# Patient Record
Sex: Female | Born: 2000 | Race: White | Hispanic: Yes | Marital: Single | Smoking: Never smoker
Health system: Southern US, Community
[De-identification: ages and names within clinical notes are randomized; demographics above are authoritative.]

## PROBLEM LIST (undated history)

## (undated) DIAGNOSIS — G43909 Migraine, unspecified, not intractable, without status migrainosus: Secondary | ICD-10-CM

## (undated) HISTORY — DX: Migraine, unspecified, not intractable, without status migrainosus: G43.909

## (undated) HISTORY — PX: NO PAST SURGERIES: SHX2092

---
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
## 2018-03-08 ENCOUNTER — Encounter: Payer: Self-pay | Admitting: Advanced Practice Midwife

## 2018-03-08 ENCOUNTER — Ambulatory Visit (INDEPENDENT_AMBULATORY_CARE_PROVIDER_SITE_OTHER): Payer: Medicaid Other | Admitting: Advanced Practice Midwife

## 2018-03-08 ENCOUNTER — Other Ambulatory Visit (HOSPITAL_COMMUNITY)
Admission: RE | Admit: 2018-03-08 | Discharge: 2018-03-08 | Disposition: A | Discharge status: Home / Self Care | Payer: Medicaid Other | Source: Ambulatory Visit | Attending: Advanced Practice Midwife | Admitting: Advanced Practice Midwife

## 2018-03-08 VITALS — BP 126/78 | HR 91 | Ht 64.0 in | Wt 212.0 lb

## 2018-03-08 DIAGNOSIS — Z1379 Encounter for other screening for genetic and chromosomal anomalies: Secondary | ICD-10-CM

## 2018-03-08 DIAGNOSIS — Z3402 Encounter for supervision of normal first pregnancy, second trimester: Secondary | ICD-10-CM | POA: Insufficient documentation

## 2018-03-08 DIAGNOSIS — Z3400 Encounter for supervision of normal first pregnancy, unspecified trimester: Secondary | ICD-10-CM

## 2018-03-08 DIAGNOSIS — O219 Vomiting of pregnancy, unspecified: Secondary | ICD-10-CM

## 2018-03-08 NOTE — Progress Notes (Signed)
   PRENATAL VISIT NOTE  Subjective:  Rebecca Woodward is a 17 y.o. G1P0 at [redacted]w[redacted]d being seen today for initial prenatal visit.  She is currently monitored for the following issues for this low-risk pregnancy and does not have a problem list on file.  Patient reports occasional nausea.   . Vag. Bleeding: None.  Movement: Absent. Denies leaking of fluid.   The following portions of the patient's history were reviewed and updated as appropriate: allergies, current medications, past family history, past medical history, past social history, past surgical history and problem list. Problem list updated.  Objective:   Vitals:   03/08/18 0816 03/08/18 0819  BP: 126/78   Pulse: 91   Weight: 212 lb (96.2 kg)   Height:   (1.626 m)    Fetal Status: Fetal Heart Rate (bpm): 154   Movement: Absent    VS reviewed, nursing note reviewed,  Constitutional: well developed, well nourished, no distress HEENT: normocephalic HEART: normal rate, heart sounds, regular rhythm RESP: normal effort, lung sounds clear and equal bilaterally Breast Exam:  Deferred Abdomen: soft Neuro: alert and oriented x 3 Skin: warm, dry Psych: affect normal Pelvic exam: Deferred  Assessment and Plan:  Pregnancy: G1P0 at [redacted]w[redacted]d  1. Supervision of normal first pregnancy, antepartum --Reviewed safety in pregnancy --Offered genetic screening including NIPS testing.  Discussed risks/benefits/alternatives including selecting not to do serum genetic screening. Pt desires genetic screening and FOB has family hx of Trisomy 34. - Obstetric panel - HIV antibody (with reflex) - GC/Chlamydia probe amp ([place])not at [facility] - Cystic fibrosis diagnostic study  2. Encounter for genetic screening for birth defect  - [place] MFM OB COMP + 14 WK; Future - Genetic Screening  3. Nausea and vomiting during pregnancy prior to [redacted] weeks gestation --Pt managing well with diet, improving since early pregnancy.  Declines medication  management.  Preterm labor symptoms and general obstetric precautions including but not limited to vaginal bleeding, contractions, leaking of fluid and fetal movement were reviewed in detail with the patient. Please refer to After Visit Summary for other counseling recommendations.  Return in about 1 month (around 04/05/2018).  Future Appointments  Date Time Provider Department Center  04/05/2018  8:15 AM Kendell Bane [facility]  04/09/2018  8:45 AM [facility] [place] 2 [facility]    Sharen Counter, CNM

## 2018-03-08 NOTE — Progress Notes (Signed)
FOB's brother has Down Syndrome. PT interested in genetic testing

## 2018-03-08 NOTE — Patient Instructions (Signed)

## 2018-03-09 ENCOUNTER — Other Ambulatory Visit: Payer: Medicaid Other

## 2018-03-09 DIAGNOSIS — Z3400 Encounter for supervision of normal first pregnancy, unspecified trimester: Secondary | ICD-10-CM | POA: Insufficient documentation

## 2018-03-09 LAB — GC/CHLAMYDIA PROBE AMP (~~LOC~~) NOT AT ARMC
Chlamydia: NEGATIVE
Neisseria Gonorrhea: NEGATIVE

## 2018-03-16 LAB — OBSTETRIC PANEL
Antibody Screen: NOT DETECTED
Basophils Absolute: 23 cells/uL (ref 0–200)
Basophils Relative: 0.3 %
Eosinophils Absolute: 239 cells/uL (ref 15–500)
Eosinophils Relative: 3.1 %
HCT: 33.9 % — ABNORMAL LOW (ref 35.0–45.0)
Hemoglobin: 11.4 g/dL — ABNORMAL LOW (ref 11.7–15.5)
Hepatitis B Surface Ag: NONREACTIVE
Lymphs Abs: 1409 cells/uL (ref 850–3900)
MCH: 27.3 pg (ref 27.0–33.0)
MCHC: 33.6 g/dL (ref 32.0–36.0)
MCV: 81.3 fL (ref 80.0–100.0)
MPV: 10.5 fL (ref 7.5–12.5)
Monocytes Relative: 5.9 %
Neutro Abs: 5575 cells/uL (ref 1500–7800)
Neutrophils Relative %: 72.4 %
Platelets: 293 10*3/uL (ref 140–400)
RBC: 4.17 10*6/uL (ref 3.80–5.10)
RDW: 14.4 % (ref 11.0–15.0)
RPR Ser Ql: NONREACTIVE
Rubella: 4.3 index
Total Lymphocyte: 18.3 %
WBC mixed population: 454 cells/uL (ref 200–950)
WBC: 7.7 10*3/uL (ref 3.8–10.8)

## 2018-03-16 LAB — CYSTIC FIBROSIS DIAGNOSTIC STUDY

## 2018-03-16 LAB — HIV ANTIBODY (ROUTINE TESTING W REFLEX): HIV 1&2 Ab, 4th Generation: NONREACTIVE

## 2018-03-31 ENCOUNTER — Encounter (HOSPITAL_COMMUNITY): Payer: Self-pay

## 2018-04-05 ENCOUNTER — Ambulatory Visit (INDEPENDENT_AMBULATORY_CARE_PROVIDER_SITE_OTHER): Payer: Medicaid Other | Admitting: Advanced Practice Midwife

## 2018-04-05 ENCOUNTER — Encounter (INDEPENDENT_AMBULATORY_CARE_PROVIDER_SITE_OTHER): Payer: Self-pay | Admitting: *Deleted

## 2018-04-05 VITALS — BP 110/70 | HR 94 | Wt 213.0 lb

## 2018-04-05 DIAGNOSIS — Z029 Encounter for administrative examinations, unspecified: Secondary | ICD-10-CM

## 2018-04-05 DIAGNOSIS — Z3402 Encounter for supervision of normal first pregnancy, second trimester: Secondary | ICD-10-CM

## 2018-04-05 DIAGNOSIS — Z3400 Encounter for supervision of normal first pregnancy, unspecified trimester: Secondary | ICD-10-CM

## 2018-04-05 NOTE — Patient Instructions (Signed)

## 2018-04-05 NOTE — Progress Notes (Signed)
   PRENATAL VISIT NOTE  Subjective:  Rebecca Woodward is a 17 y.o. G1P0 at 4174w6d being seen today for ongoing prenatal care.  She is currently monitored for the following issues for this low-risk pregnancy and has Supervision of normal first pregnancy, antepartum on their problem list.  Patient reports no complaints.   . Vag. Bleeding: None.  Movement: Absent. Denies leaking of fluid.   The following portions of the patient's history were reviewed and updated as appropriate: allergies, current medications, past family history, past medical history, past social history, past surgical history and problem list. Problem list updated.  Objective:   Vitals:   04/05/18 0808  BP: 110/70  Pulse: 94  Weight: 213 lb (96.6 kg)    Fetal Status: Fetal Heart Rate (bpm): 147   Movement: Absent     General:  Alert, oriented and cooperative. Patient is in no acute distress.  Skin: Skin is warm and dry. No rash noted.   Cardiovascular: Normal heart rate noted  Respiratory: Normal respiratory effort, no problems with respiration noted  Abdomen: Soft, gravid, appropriate for gestational age.  Pain/Pressure: Absent     Pelvic: Cervical exam deferred        Extremities: Normal range of motion.  Edema: None  Mental Status: Normal mood and affect. Normal behavior. Normal judgment and thought content.   Assessment and Plan:  Pregnancy: G1P0 at 7274w6d  1. Supervision of normal first pregnancy, antepartum --Anticipatory guidance about next visits/weeks of pregnancy given --Reviewed normal lab results, including genetic screening --Anatomy US scheduled this week - Culture, OB Urine - Alpha fetoprotein, maternal  Preterm labor symptoms and general obstetric precautions including but not limited to vaginal bleeding, contractions, leaking of fluid and fetal movement were reviewed in detail with the patient. Please refer to After Visit Summary for other counseling recommendations.  Return in about 1 month  (around 05/03/2018).  Future Appointments  Date Time Provider Department Center  04/09/2018  8:45 AM [facility] US 2 [facility]    Sharen CounterLisa Leftwich-Kirby, CNM

## 2018-04-07 LAB — URINE CULTURE, OB REFLEX

## 2018-04-07 LAB — ALPHA FETOPROTEIN, MATERNAL
AFP MoM: 0.51
AFP, Serum: 19.6 ng/mL
Calc'd Gestational Age: 18.9 weeks
Maternal Wt: 213 [lb_av]
Risk for ONTD: <1
Twins-AFP: 1

## 2018-04-07 LAB — CULTURE, OB URINE

## 2018-04-09 ENCOUNTER — Other Ambulatory Visit: Payer: Self-pay | Admitting: Advanced Practice Midwife

## 2018-04-09 ENCOUNTER — Ambulatory Visit (HOSPITAL_COMMUNITY)
Admission: RE | Admit: 2018-04-09 | Discharge: 2018-04-09 | Disposition: A | Discharge status: Home / Self Care | Payer: Medicaid Other | Source: Ambulatory Visit | Attending: Advanced Practice Midwife | Admitting: Advanced Practice Midwife

## 2018-04-09 DIAGNOSIS — Z1379 Encounter for other screening for genetic and chromosomal anomalies: Secondary | ICD-10-CM

## 2018-04-09 DIAGNOSIS — Z3A17 17 weeks gestation of pregnancy: Secondary | ICD-10-CM | POA: Insufficient documentation

## 2018-04-09 DIAGNOSIS — Z3492 Encounter for supervision of normal pregnancy, unspecified, second trimester: Secondary | ICD-10-CM

## 2018-04-09 DIAGNOSIS — Z3A19 19 weeks gestation of pregnancy: Secondary | ICD-10-CM

## 2018-04-09 DIAGNOSIS — O99212 Obesity complicating pregnancy, second trimester: Secondary | ICD-10-CM | POA: Insufficient documentation

## 2018-05-03 ENCOUNTER — Ambulatory Visit (INDEPENDENT_AMBULATORY_CARE_PROVIDER_SITE_OTHER): Payer: Medicaid Other | Admitting: Advanced Practice Midwife

## 2018-05-03 VITALS — BP 119/75 | HR 100 | Wt 216.0 lb

## 2018-05-03 DIAGNOSIS — Z3402 Encounter for supervision of normal first pregnancy, second trimester: Secondary | ICD-10-CM

## 2018-05-03 DIAGNOSIS — Z3400 Encounter for supervision of normal first pregnancy, unspecified trimester: Secondary | ICD-10-CM

## 2018-05-03 DIAGNOSIS — Z1379 Encounter for other screening for genetic and chromosomal anomalies: Secondary | ICD-10-CM

## 2018-05-03 NOTE — Patient Instructions (Signed)

## 2018-05-03 NOTE — Progress Notes (Signed)
   PRENATAL VISIT NOTE  Subjective:  Rebecca Woodward is a 17 y.o. G1P0 at 3883w5d being seen today for ongoing prenatal care.  She is currently monitored for the following issues for this low-risk pregnancy and has Supervision of normal first pregnancy, antepartum on their problem list.  Patient reports no complaints.  Contractions: Not present. Vag. Bleeding: None.  Movement: Present. Denies leaking of fluid.   The following portions of the patient's history were reviewed and updated as appropriate: allergies, current medications, past family history, past medical history, past social history, past surgical history and problem list. Problem list updated.  Objective:   Vitals:   05/03/18 0805  BP: 119/75  Pulse: 100  Weight: 216 lb (98 kg)    Fetal Status: Fetal Heart Rate (bpm): 148   Movement: Present     General:  Alert, oriented and cooperative. Patient is in no acute distress.  Skin: Skin is warm and dry. No rash noted.   Cardiovascular: Normal heart rate noted  Respiratory: Normal respiratory effort, no problems with respiration noted  Abdomen: Soft, gravid, appropriate for gestational age.  Pain/Pressure: Absent     Pelvic: Cervical exam deferred        Extremities: Normal range of motion.  Edema: None  Mental Status: Normal mood and affect. Normal behavior. Normal judgment and thought content.   Assessment and Plan:  Pregnancy: G1P0 at 8483w5d  1. Encounter for genetic screening --Follow up to complete anatomy US in 4 weeks - US MFM OB FOLLOW UP; Future  2. Supervision of normal first pregnancy, antepartum --Anticipatory guidance about next visits/weeks of pregnancy given.  Preterm labor symptoms and general obstetric precautions including but not limited to vaginal bleeding, contractions, leaking of fluid and fetal movement were reviewed in detail with the patient. Please refer to After Visit Summary for other counseling recommendations.  No follow-ups on  file.  Future Appointments  Date Time Provider Department Center  05/31/2018  8:15 AM Aviva SignsWilliams, Marie L, CNM [facility]    Sharen CounterLisa Leftwich-Kirby, CNM

## 2018-05-14 ENCOUNTER — Ambulatory Visit (HOSPITAL_COMMUNITY)
Admission: RE | Admit: 2018-05-14 | Discharge: 2018-05-14 | Disposition: A | Discharge status: Home / Self Care | Payer: Medicaid Other | Source: Ambulatory Visit | Attending: Advanced Practice Midwife | Admitting: Advanced Practice Midwife

## 2018-05-14 DIAGNOSIS — Z3A22 22 weeks gestation of pregnancy: Secondary | ICD-10-CM | POA: Diagnosis not present

## 2018-05-14 DIAGNOSIS — Z362 Encounter for other antenatal screening follow-up: Secondary | ICD-10-CM | POA: Insufficient documentation

## 2018-05-14 DIAGNOSIS — O99212 Obesity complicating pregnancy, second trimester: Secondary | ICD-10-CM

## 2018-05-14 DIAGNOSIS — Z1379 Encounter for other screening for genetic and chromosomal anomalies: Secondary | ICD-10-CM

## 2018-05-31 ENCOUNTER — Encounter: Payer: Self-pay | Admitting: Advanced Practice Midwife

## 2018-05-31 ENCOUNTER — Ambulatory Visit (INDEPENDENT_AMBULATORY_CARE_PROVIDER_SITE_OTHER): Payer: Medicaid Other | Admitting: Advanced Practice Midwife

## 2018-05-31 VITALS — BP 118/74 | HR 99 | Wt 220.0 lb

## 2018-05-31 DIAGNOSIS — Z3400 Encounter for supervision of normal first pregnancy, unspecified trimester: Secondary | ICD-10-CM

## 2018-05-31 NOTE — Progress Notes (Incomplete)
   PRENATAL VISIT NOTE  Subjective:  Rebecca Woodward is a 17 y.o. G1P0 at 6980w5d being seen today for ongoing prenatal care.  She is currently monitored for the following issues for this {Blank single:19197::"high-risk","low-risk"} pregnancy and has Supervision of normal first pregnancy, antepartum; Attention deficit hyperactivity disorder (ADHD); Epilepsy (HCC); and Overweight on their problem list.  Patient reports {sx:14538}.  Contractions: Not present. Vag. Bleeding: None.  Movement: Present. Denies leaking of fluid.   The following portions of the patient's history were reviewed and updated as appropriate: allergies, current medications, past family history, past medical history, past social history, past surgical history and problem list. Problem list updated.  Objective:   Vitals:   05/31/18 0807  BP: 118/74  Pulse: 99  Weight: 220 lb (99.8 kg)    Fetal Status: Fetal Heart Rate (bpm): 146   Movement: Present     General:  Alert, oriented and cooperative. Patient is in no acute distress.  Skin: Skin is warm and dry. No rash noted.   Cardiovascular: Normal heart rate noted  Respiratory: Normal respiratory effort, no problems with respiration noted  Abdomen: Soft, gravid, appropriate for gestational age.  Pain/Pressure: Absent     Pelvic: {Blank single:19197::"Cervical exam performed","Cervical exam deferred"}        Extremities: Normal range of motion.  Edema: None  Mental Status: Normal mood and affect. Normal behavior. Normal judgment and thought content.   Assessment and Plan:  Pregnancy: G1P0 at 6580w5d  1. Supervision of normal first pregnancy, antepartum ***  2. 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 Please refer  to After Visit Summary for other counseling recommendations.  Return in about 1 month (around 06/28/2018) [place] [facility].  Future Appointments  Date Time Provider Department Center  06/30/2018  8:15 AM Allie Bossierove, Myra C, MD [facility]    Wynelle BourgeoisMarie Kashlyn Salinas, CNM

## 2018-05-31 NOTE — Progress Notes (Deleted)
   PRENATAL VISIT NOTE  Subjective:  Rebecca Woodward is a 17 y.o. G1P0 at [redacted]w[redacted]d being seen today for ongoing prenatal care.  She is currently monitored for the following issues for this {Blank single:19197::"high-risk","low-risk"} pregnancy and has Supervision of normal first pregnancy, antepartum; Attention deficit hyperactivity disorder (ADHD); Epilepsy (HCC); and Overweight on their problem list.  Patient reports {sx:14538}.  Contractions: Not present. Vag. Bleeding: None.  Movement: Present. Denies leaking of fluid.   The following portions of the patient's history were reviewed and updated as appropriate: allergies, current medications, past family history, past medical history, past social history, past surgical history and problem list. Problem list updated.  Objective:   Vitals:   05/31/18 0807  BP: 118/74  Pulse: 99  Weight: 220 lb (99.8 kg)    Fetal Status: Fetal Heart Rate (bpm): 146   Movement: Present     General:  Alert, oriented and cooperative. Patient is in no acute distress.  Skin: Skin is warm and dry. No rash noted.   Cardiovascular: Normal heart rate noted  Respiratory: Normal respiratory effort, no problems with respiration noted  Abdomen: Soft, gravid, appropriate for gestational age.  Pain/Pressure: Absent     Pelvic: {Blank single:19197::"Cervical exam performed","Cervical exam deferred"}        Extremities: Normal range of motion.  Edema: None  Mental Status: Normal mood and affect. Normal behavior. Normal judgment and thought content.   Assessment and Plan:  Pregnancy: G1P0 at [redacted]w[redacted]d  1. Supervision of normal first pregnancy, antepartum ***  2. 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 Please refer  to After Visit Summary for other counseling recommendations.  Return in about 1 month (around 06/28/2018) for [place] [facility].  Future Appointments  Date Time Provider Department Center  06/30/2018  8:15 AM Dove, Myra C, MD [facility]    Kiarah Eckstein, CNM 

## 2018-05-31 NOTE — Progress Notes (Signed)
   PRENATAL VISIT NOTE  Subjective:  Rebecca Woodward is a 17 y.o. G1P0 at 2250w5d being seen today for ongoing prenatal care.  She is currently monitored for the following issues for this low-risk pregnancy and has Supervision of normal first pregnancy, antepartum; Attention deficit hyperactivity disorder (ADHD); Epilepsy (HCC); and Overweight on their problem list.  Records reviewed.  Epilepsy was ED diagnosis but upon evaluation, they deemed it to be syncope with possible convulsion.   Patient reports Hard to sleep on back, "baby doesn't like left side".  has only been sleeping on left side..  Contractions: Not present. Vag. Bleeding: None.  Movement: Present. Denies leaking of fluid.   The following portions of the patient's history were reviewed and updated as appropriate: allergies, current medications, past family history, past medical history, past social history, past surgical history and problem list. Problem list updated.  Objective:   Vitals:   05/31/18 0807  BP: 118/74  Pulse: 99  Weight: 220 lb (99.8 kg)    Fetal Status: Fetal Heart Rate (bpm): 146   Movement: Present     General:  Alert, oriented and cooperative. Patient is in no acute distress.  Skin: Skin is warm and dry. No rash noted.   Cardiovascular: Normal heart rate noted  Respiratory: Normal respiratory effort, no problems with respiration noted  Abdomen: Soft, gravid, appropriate for gestational age.  Pain/Pressure: Absent     Pelvic: Cervical exam deferred        Extremities: Normal range of motion.  Edema: None  Mental Status: Normal mood and affect. Normal behavior. Normal judgment and thought content.   Assessment and Plan:  Pregnancy: G1P0 at 4750w5d  1. Supervision of normal first pregnancy, antepartum Discussed sleeping positions, Discussed fetal movement and warning signs to report Reviewed normal ultrasound  2. 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 Please refer to After Visit Summary for other counseling recommendations.  Return in about 1 month (around 06/28/2018) [place] [facility].  Future Appointments  Date Time Provider Department Center  06/30/2018  8:15 AM Allie Bossierove, Myra C, MD [facility]    Wynelle BourgeoisMarie Kerolos Nehme, CNM

## 2018-05-31 NOTE — Patient Instructions (Signed)
Second Trimester of Pregnancy The second trimester is from week 13 through week 28, month 4 through 6. This is often the time in pregnancy that you feel your best. Often times, morning sickness has lessened or quit. You may have more energy, and you may get hungry more often. Your unborn baby (fetus) is growing rapidly. At the end of the sixth month, he or she is about 9 inches long and weighs about 1 pounds. You will likely feel the baby move (quickening) between 18 and 20 weeks of pregnancy. Follow these instructions at home:  Avoid all smoking, herbs, and alcohol. Avoid drugs not approved by your doctor.  Do not use any tobacco products, including cigarettes, chewing tobacco, and electronic cigarettes. If you need help quitting, ask your doctor. You may get counseling or other support to help you quit.  Only take medicine as told by your doctor. Some medicines are safe and some are not during pregnancy.  Exercise only as told by your doctor. Stop exercising if you start having cramps.  Eat regular, healthy meals.  Wear a good support bra if your breasts are tender.  Do not use hot tubs, steam rooms, or saunas.  Wear your seat belt when driving.  Avoid raw meat, uncooked cheese, and liter boxes and soil used by cats.  Take your prenatal vitamins.  Take 1500-2000 milligrams of calcium daily starting at the 20th week of pregnancy until you deliver your baby.  Try taking medicine that helps you poop (stool softener) as needed, and if your doctor approves. Eat more fiber by eating fresh fruit, vegetables, and whole grains. Drink enough fluids to keep your pee (urine) clear or pale yellow.  Take warm water baths (sitz baths) to soothe pain or discomfort caused by hemorrhoids. Use hemorrhoid cream if your doctor approves.  If you have puffy, bulging veins (varicose veins), wear support hose. Raise (elevate) your feet for 15 minutes, 3-4 times a day. Limit salt in your diet.  Avoid heavy  lifting, wear low heals, and sit up straight.  Rest with your legs raised if you have leg cramps or low back pain.  Visit your dentist if you have not gone during your pregnancy. Use a soft toothbrush to brush your teeth. Be gentle when you floss.  You can have sex (intercourse) unless your doctor tells you not to.  Go to your doctor visits. Get help if:  You feel dizzy.  You have mild cramps or pressure in your lower belly (abdomen).  You have a nagging pain in your belly area.  You continue to feel sick to your stomach (nauseous), throw up (vomit), or have watery poop (diarrhea).  You have bad smelling fluid coming from your vagina.  You have pain with peeing (urination). Get help right away if:  You have a fever.  You are leaking fluid from your vagina.  You have spotting or bleeding from your vagina.  You have severe belly cramping or pain.  You lose or gain weight rapidly.  You have trouble catching your breath and have chest pain.  You notice sudden or extreme puffiness (swelling) of your face, hands, ankles, feet, or legs.  You have not felt the baby move in over an hour.  You have severe headaches that do not go away with medicine.  You have vision changes. This information is not intended to replace advice given to you by your health care provider. Make sure you discuss any questions you have with your health care   provider. Document Released: 01/07/2010 Document Revised: 03/20/2016 Document Reviewed: 12/14/2012 Elsevier Interactive Patient Education  2017 [employer]vier Inc.  

## 2018-06-30 ENCOUNTER — Ambulatory Visit (INDEPENDENT_AMBULATORY_CARE_PROVIDER_SITE_OTHER): Payer: Medicaid Other | Admitting: Obstetrics & Gynecology

## 2018-06-30 DIAGNOSIS — Z3400 Encounter for supervision of normal first pregnancy, unspecified trimester: Secondary | ICD-10-CM

## 2018-06-30 DIAGNOSIS — O99213 Obesity complicating pregnancy, third trimester: Secondary | ICD-10-CM

## 2018-06-30 DIAGNOSIS — Z23 Encounter for immunization: Secondary | ICD-10-CM

## 2018-06-30 DIAGNOSIS — Z3403 Encounter for supervision of normal first pregnancy, third trimester: Secondary | ICD-10-CM

## 2018-06-30 DIAGNOSIS — O99210 Obesity complicating pregnancy, unspecified trimester: Secondary | ICD-10-CM

## 2018-06-30 DIAGNOSIS — E669 Obesity, unspecified: Secondary | ICD-10-CM

## 2018-06-30 NOTE — Progress Notes (Signed)
   PRENATAL VISIT NOTE  Subjective:  Rebecca Woodward is a 17 y.o. G1P0 at [redacted]w[redacted]d being seen today for ongoing prenatal care.  She is currently monitored for the following issues for this low-risk pregnancy and has Supervision of normal first pregnancy, antepartum; Attention deficit hyperactivity disorder (ADHD); Epilepsy (HCC); and Obesity in pregnancy on their problem list.  Patient reports no complaints.  Contractions: Not present. Vag. Bleeding: None.  Movement: Present. Denies leaking of fluid.   The following portions of the patient's history were reviewed and updated as appropriate: allergies, current medications, past family history, past medical history, past social history, past surgical history and problem list. Problem list updated.  Objective:   Vitals:   06/30/18 0808  BP: 121/78  Pulse: (!) 102  Weight: 222 lb (100.7 kg)    Fetal Status: Fetal Heart Rate (bpm): 136   Movement: Present     General:  Alert, oriented and cooperative. Patient is in no acute distress.  Skin: Skin is warm and dry. No rash noted.   Cardiovascular: Normal heart rate noted  Respiratory: Normal respiratory effort, no problems with respiration noted  Abdomen: Soft, gravid, appropriate for gestational age.  Pain/Pressure: Absent     Pelvic: Cervical exam deferred        Extremities: Normal range of motion.  Edema: None  Mental Status: Normal mood and affect. Normal behavior. Normal judgment and thought content.   Assessment and Plan:  Pregnancy: G1P0 at [redacted]w[redacted]d  1. Supervision of normal first pregnancy, antepartum  - Flu Vaccine QUAD 36+ mos IM (Fluarix, Quad PF) - 2Hr GTT w/ 1 Hr Carpenter 75 g - HIV antibody (with reflex) - RPR - CBC - The labs will be drawn next week because she is not fasting today. 2. Obesity in pregnancy   Preterm labor symptoms and general obstetric precautions including but not limited to vaginal bleeding, contractions, leaking of fluid and fetal movement were  reviewed in detail with the patient. Please refer to After Visit Summary for other counseling recommendations.  Return in about 3 weeks (around 07/21/2018) for She will need 2 hour GTT at the lab in 1 week.  No future appointments.  Allie Bossier, MD

## 2018-06-30 NOTE — Progress Notes (Signed)
Pt states she will do GTT at next visit

## 2018-07-20 ENCOUNTER — Telehealth: Payer: Self-pay

## 2018-07-20 ENCOUNTER — Encounter: Payer: Self-pay | Admitting: Obstetrics & Gynecology

## 2018-07-20 DIAGNOSIS — O99019 Anemia complicating pregnancy, unspecified trimester: Secondary | ICD-10-CM | POA: Insufficient documentation

## 2018-07-20 LAB — 2HR GTT W 1 HR, CARPENTER, 75 G
Glucose, 1 Hr, Gest: 148 mg/dL (ref 65–179)
Glucose, 2 Hr, Gest: 87 mg/dL (ref 65–152)
Glucose, Fasting, Gest: 79 mg/dL (ref 65–91)

## 2018-07-20 LAB — CBC
HCT: 31 % — ABNORMAL LOW (ref 35.0–45.0)
Hemoglobin: 10.1 g/dL — ABNORMAL LOW (ref 11.7–15.5)
MCH: 26.7 pg — ABNORMAL LOW (ref 27.0–33.0)
MCHC: 32.6 g/dL (ref 32.0–36.0)
MCV: 82 fL (ref 80.0–100.0)
MPV: 10.4 fL (ref 7.5–12.5)
Platelets: 322 10*3/uL (ref 140–400)
RBC: 3.78 10*6/uL — ABNORMAL LOW (ref 3.80–5.10)
RDW: 14 % (ref 11.0–15.0)
WBC: 7.7 10*3/uL (ref 3.8–10.8)

## 2018-07-20 LAB — RPR: RPR Ser Ql: NONREACTIVE

## 2018-07-20 LAB — HIV ANTIBODY (ROUTINE TESTING W REFLEX): HIV 1&2 Ab, 4th Generation: NONREACTIVE

## 2018-07-20 NOTE — Telephone Encounter (Signed)
Spoke with pt's mother who is on her DPR and she is aware that pt passed 2 hour GTT

## 2018-07-21 ENCOUNTER — Ambulatory Visit (INDEPENDENT_AMBULATORY_CARE_PROVIDER_SITE_OTHER): Payer: Medicaid Other | Admitting: Obstetrics and Gynecology

## 2018-07-21 ENCOUNTER — Encounter: Payer: Self-pay | Admitting: Obstetrics and Gynecology

## 2018-07-21 VITALS — BP 114/74 | HR 93 | Wt 224.0 lb

## 2018-07-21 DIAGNOSIS — Z3400 Encounter for supervision of normal first pregnancy, unspecified trimester: Secondary | ICD-10-CM

## 2018-07-21 DIAGNOSIS — F909 Attention-deficit hyperactivity disorder, unspecified type: Secondary | ICD-10-CM

## 2018-07-21 DIAGNOSIS — O99013 Anemia complicating pregnancy, third trimester: Secondary | ICD-10-CM

## 2018-07-21 DIAGNOSIS — Z3403 Encounter for supervision of normal first pregnancy, third trimester: Secondary | ICD-10-CM

## 2018-07-21 DIAGNOSIS — G40409 Other generalized epilepsy and epileptic syndromes, not intractable, without status epilepticus: Secondary | ICD-10-CM

## 2018-07-21 NOTE — Progress Notes (Signed)
   PRENATAL VISIT NOTE  Subjective:  Rebecca Woodward is a 17 y.o. G1P0 at [redacted]w[redacted]d being seen today for ongoing prenatal care.  She is currently monitored for the following issues for this high-risk pregnancy and has Supervision of normal first pregnancy, antepartum; Attention deficit hyperactivity disorder (ADHD); Epilepsy (HCC); Obesity in pregnancy; and Anemia in pregnancy on their problem list.  Patient reports no complaints.  Contractions: Not present. Vag. Bleeding: None.  Movement: Present. Denies leaking of fluid.   The following portions of the patient's history were reviewed and updated as appropriate: allergies, current medications, past family history, past medical history, past social history, past surgical history and problem list. Problem list updated.  Objective:   Vitals:   07/21/18 0915  BP: 114/74  Pulse: 93  Weight: 224 lb (101.6 kg)    Fetal Status:   Fundal Height: 32 cm Movement: Present     General:  Alert, oriented and cooperative. Patient is in no acute distress.  Skin: Skin is warm and dry. No rash noted.   Cardiovascular: Normal heart rate noted  Respiratory: Normal respiratory effort, no problems with respiration noted  Abdomen: Soft, gravid, appropriate for gestational age.  Pain/Pressure: Absent     Pelvic: Cervical exam deferred        Extremities: Normal range of motion.  Edema: None  Mental Status: Normal mood and affect. Normal behavior. Normal judgment and thought content.   Assessment and Plan:  Pregnancy: G1P0 at [redacted]w[redacted]d  1. Supervision of normal first pregnancy, antepartum Reviewed contraception  2. Anemia during pregnancy in third trimester Cont PO iron  3. Other generalized epilepsy, not intractable, without status epilepticus (HCC) Per notes, related to syncope as a child  4. Attention deficit hyperactivity disorder (ADHD), unspecified ADHD type   Preterm labor symptoms and general obstetric precautions including but not limited to  vaginal bleeding, contractions, leaking of fluid and fetal movement were reviewed in detail with the patient. Please refer to After Visit Summary for other counseling recommendations.  Return in about 3 weeks (around 08/11/2018) for OB visit.  Future Appointments  Date Time Provider Department Center  08/10/2018  8:15 AM Sharyon Cable, CNM [facility]    Conan Bowens, MD

## 2018-07-21 NOTE — Patient Instructions (Addendum)
AREA PEDIATRIC/FAMILY PRACTICE PHYSICIANS  [facility] [address] [place], [place]  [address] Phone - [phone]   Fax - [phone]  [facility] [address] [place], [place] [address] Phone - [phone]   Fax - [phone]  JACK AMOS [address] [place], [place]  [address] Phone - [phone]   Fax - [phone]  [facility] [address] [place], [place]  [address] Phone - [phone]   Fax - [phone]  [facility] [address] [place], [place]  [address] Phone - [phone]   Fax - [phone]  [facility] [address] [place], [place]  [address] Phone - [phone]   Fax - [phone]  [facility]  [address] [place], [place]  [address] Phone - [phone]   Fax - [phone]  [facility] AT [facility] [address] [place], [address] [place], [place]  [address] Phone - [phone]   Fax - [phone]  [facility] AT [facility] [address] [place], [place]  [address] Phone - [phone]   Fax - [phone] [facility] [place] [address] [place], [place]  [address] Phone - [phone]   Fax - [phone]  [facility] [address] [place], [place]  [address] Phone - [phone]   Fax - [phone]  [facility] [address] [place], [place]  [address] Phone - [phone]   Fax - [phone]  [facility] [address] [place], [place]  [address] Phone - [phone]   Fax - [phone]  [facility] [address], [address] [place], [place]  [address] Phone - [phone]  [facility] [address] [place], [place]  [address] Phone - [phone]   Fax - [phone]  [facility] [address] [place], [place]  [address] Phone - [phone]   Fax - [phone]  [facility] [address] [place], [place]  [address] Phone - [phone]   Fax - [phone]  [facility] [address] [place], [place]  [address] Phone - [phone]   Fax - [phone]   [facility] [address] [place], [place]  [address] Phone - [phone]   Fax - [phone]  [facility] [address] [place], [place]  [address] Phone - [phone]   Fax - [phone]  DAVID RUBIN [address] [place], [place]  [address] Phone - [phone]   Fax - [phone]  [facility] [address] [place], [place]  [address] Phone - [phone]   Fax - [phone]  [facility] Alita Chyle [address] [place], [place]  [address] Phone - [phone]   Fax - [phone] Gerarda Fraction [address] [place], [place]  [address] Phone - [phone]   Fax - [phone]  [facility] [address] [place], [place]  [address] Phone - [phone]   Fax - [phone]  [facility] [place] [address] [place], [place]  [address] Phone - [phone]   Fax - [phone]  [facility] [place] Wyvonne Lenz MD [address] [place] [address] Phone [phone]  Fax [phone]  Contraception Choices Contraception, also called birth control, refers to methods or devices that prevent pregnancy.  Hormonal methods Contraceptive implant A contraceptive implant is a thin, plastic tube that contains a hormone. It is inserted into the upper part of the arm. It can remain in place for up to 3 years. Progestin-only injections Progestin-only injections are injections of progestin, a synthetic form of the hormone progesterone. They are given every 3 months by a health care  provider. Birth control pills Birth control pills are pills that contain hormones that prevent pregnancy. They must be taken once a day, preferably at the same time each day. Birth control patch The birth control patch contains hormones that prevent pregnancy. It is placed on the skin and must be changed once a week for three weeks and removed on the fourth week. A prescription is needed to use this method of contraception. Vaginal ring A vaginal ring contains hormones that prevent pregnancy. It is placed in the vagina for three weeks and removed on the fourth week. After that, the process is repeated with a new ring. A prescription is needed to use this method of contraception. Emergency contraceptive Emergency contraceptives prevent pregnancy after unprotected sex. They come in pill form and can be taken up to 5 days after sex. They work best the sooner they are taken after having sex. Most emergency contraceptives are available without a prescription. This method should not be used as your only form of birth control. Barrier methods Female condom A female condom is a thin sheath that is worn over the penis during sex. Condoms keep sperm from going inside a woman's body. They can be used with a spermicide to increase their effectiveness. They should be disposed after a single use. Female condom A female condom is a soft, loose-fitting sheath that is put into the vagina before sex. The condom keeps sperm from going inside a woman's body. They should be disposed after a single use.  Intrauterine contraception Intrauterine device (IUD) An IUD is a T-shaped device that is put in a woman's uterus. There are two types:  Hormone IUD.This type contains progestin, a synthetic form of the hormone progesterone. This type can stay in place for 3-5 years.  Copper IUD.This type is wrapped in copper wire. It can stay in place for 10 years.  Permanent methods of contraception Female tubal ligation In this  method, a woman's fallopian tubes are sealed, tied, or blocked during surgery to prevent eggs from traveling to the uterus.  Female sterilization This is a procedure to tie off the tubes that carry sperm (vasectomy). After the procedure, the man can still ejaculate fluid (semen).  Summary  Contraception, also called birth control, means methods or devices that prevent pregnancy.  Hormonal methods of contraception include implants, injections, pills, patches, vaginal rings, and emergency contraceptives.  Barrier methods of contraception can include female condoms, female condoms, diaphragms, cervical caps, sponges, and spermicides.  There are two types of IUDs (intrauterine devices). An IUD can be put in a woman's uterus to prevent pregnancy for 3-5 years.  Permanent sterilization can be done through a procedure for males, females, or both. This information is not intended to replace advice given to you by your health care provider. Make sure you discuss any questions you have with your health care provider. Document Released: 10/13/2005 Document Revised: 11/15/2016 Document Reviewed: 11/15/2016 Elsevier Interactive Patient Education  2018 [employer].   Etonogestrel implant What is this medicine? ETONOGESTREL (et oh noe JES trel) is a contraceptive (birth control) device. It is used to prevent pregnancy. It can be used for  up to 3 years. This medicine may be used for other purposes; ask your health care provider or pharmacist if you have questions. COMMON BRAND NAME(S): Implanon, Nexplanon What should I tell my health care provider before I take this medicine? They need to know if you have any of these conditions: -abnormal vaginal bleeding -blood vessel disease or blood clots -cancer of the breast, cervix, or liver -depression -diabetes -gallbladder disease -headaches -heart disease or recent heart attack -high blood pressure -high cholesterol -kidney disease -liver  disease -renal disease -seizures -tobacco smoker -an unusual or allergic reaction to etonogestrel, other hormones, anesthetics or antiseptics, medicines, foods, dyes, or preservatives -pregnant or trying to get pregnant -breast-feeding How should I use this medicine? This device is inserted just under the skin on the inner side of your upper arm by a health care professional. Talk to your pediatrician regarding the use of this medicine in children. Special care may be needed. Overdosage: If you think you have taken too much of this medicine contact a poison control center or emergency room at once. NOTE: This medicine is only for you. Do not share this medicine with others. What if I miss a dose? This does not apply. What may interact with this medicine? Do not take this medicine with any of the following medications: -amprenavir -bosentan -fosamprenavir This medicine may also interact with the following medications: -barbiturate medicines for inducing sleep or treating seizures -certain medicines for fungal infections like ketoconazole and itraconazole -grapefruit juice -griseofulvin -medicines to treat seizures like carbamazepine, felbamate, oxcarbazepine, phenytoin, topiramate -modafinil -phenylbutazone -rifampin -rufinamide -some medicines to treat HIV infection like atazanavir, indinavir, lopinavir, nelfinavir, tipranavir, ritonavir -St. John's wort This list may not describe all possible interactions. Give your health care provider a list of all the medicines, herbs, non-prescription drugs, or dietary supplements you use. Also tell them if you smoke, drink alcohol, or use illegal drugs. Some items may interact with your medicine. What should I watch for while using this medicine? This product does not protect you against HIV infection (AIDS) or other sexually transmitted diseases. You should be able to feel the implant by pressing your fingertips over the skin where it was  inserted. Contact your doctor if you cannot feel the implant, and use a non-hormonal birth control method (such as condoms) until your doctor confirms that the implant is in place. If you feel that the implant may have broken or become bent while in your arm, contact your healthcare provider. What side effects may I notice from receiving this medicine? Side effects that you should report to your doctor or health care professional as soon as possible: -allergic reactions like skin rash, itching or hives, swelling of the face, lips, or tongue -breast lumps -changes in emotions or moods -depressed mood -heavy or prolonged menstrual bleeding -pain, irritation, swelling, or bruising at the insertion site -scar at site of insertion -signs of infection at the insertion site such as fever, and skin redness, pain or discharge -signs of pregnancy -signs and symptoms of a blood clot such as breathing problems; changes in vision; chest pain; severe, sudden headache; pain, swelling, warmth in the leg; trouble speaking; sudden numbness or weakness of the face, arm or leg -signs and symptoms of liver injury like dark yellow or brown urine; general ill feeling or flu-like symptoms; light-colored stools; loss of appetite; nausea; right upper belly pain; unusually weak or tired; yellowing of the eyes or skin -unusual vaginal bleeding, discharge -signs and symptoms of a stroke  like changes in vision; confusion; trouble speaking or understanding; severe headaches; sudden numbness or weakness of the face, arm or leg; trouble walking; dizziness; loss of balance or coordination Side effects that usually do not require medical attention (report to your doctor or health care professional if they continue or are bothersome): -acne -back pain -breast pain -changes in weight -dizziness -general ill feeling or flu-like symptoms -headache -irregular menstrual bleeding -nausea -sore throat -vaginal irritation or  inflammation This list may not describe all possible side effects. Call your doctor for medical advice about side effects. You may report side effects to FDA at 1-800-FDA-1088. Where should I keep my medicine? This drug is given in a hospital or clinic and will not be stored at home. NOTE: This sheet is a summary. It may not cover all possible information. If you have questions about this medicine, talk to your doctor, pharmacist, or health care provider.  2018 Elsevier/Gold Standard (2016-05-01 11:19:22)  Intrauterine Device Information An intrauterine device (IUD) is inserted into your uterus to prevent pregnancy. There are two types of IUDs available:  Copper IUD-This type of IUD is wrapped in copper wire and is placed inside the uterus. Copper makes the uterus and fallopian tubes produce a fluid that kills sperm. The copper IUD can stay in place for 10 years.  Hormone IUD-This type of IUD contains the hormone progestin (synthetic progesterone). The hormone thickens the cervical mucus and prevents sperm from entering the uterus. It also thins the uterine lining to prevent implantation of a fertilized egg. The hormone can weaken or kill the sperm that get into the uterus. One type of hormone IUD can stay in place for 5 years, and another type can stay in place for 3 years.  Your health care provider will make sure you are a good candidate for a contraceptive IUD. Discuss with your health care provider the possible side effects. Advantages of an intrauterine device  IUDs are highly effective, reversible, long acting, and low maintenance.  There are no estrogen-related side effects.  An IUD can be used when breastfeeding.  IUDs are not associated with weight gain.  The copper IUD works immediately after insertion.  The hormone IUD works right away if inserted within 7 days of your period starting. You will need to use a backup method of birth control for 7 days if the hormone IUD is  inserted at any other time in your cycle.  The copper IUD does not interfere with your female hormones.  The hormone IUD can make heavy menstrual periods lighter and decrease cramping.  The hormone IUD can be used for 3 or 5 years.  The copper IUD can be used for 10 years. Disadvantages of an intrauterine device  The hormone IUD can be associated with irregular bleeding patterns.  The copper IUD can make your menstrual flow heavier and more painful.  You may experience cramping and vaginal bleeding after insertion. This information is not intended to replace advice given to you by your health care provider. Make sure you discuss any questions you have with your health care provider. Document Released: 09/16/2004 Document Revised: 03/20/2016 Document Reviewed: 04/03/2013 Elsevier Interactive Patient Education  2017 [employer].

## 2018-08-10 ENCOUNTER — Encounter: Payer: Medicaid Other | Admitting: Certified Nurse Midwife

## 2018-08-13 ENCOUNTER — Ambulatory Visit (INDEPENDENT_AMBULATORY_CARE_PROVIDER_SITE_OTHER): Payer: Medicaid Other | Admitting: Certified Nurse Midwife

## 2018-08-13 VITALS — BP 122/74 | HR 83 | Wt 227.0 lb

## 2018-08-13 DIAGNOSIS — Z3400 Encounter for supervision of normal first pregnancy, unspecified trimester: Secondary | ICD-10-CM

## 2018-08-13 NOTE — Patient Instructions (Signed)
Pregnancy and Anemia Anemia is a condition in which the concentration of red blood cells or hemoglobin in the blood is below normal. Hemoglobin is a substance in red blood cells that carries oxygen to the tissues of the body. Anemia results in not enough oxygen reaching these tissues. Anemia during pregnancy is common because the fetus uses more iron and folic acid as it is developing. Your body may not produce enough red blood cells because of this. Also, during pregnancy, the liquid part of the blood (plasma) increases by about 50%, and the red blood cells increase by only 25%. This lowers the concentration of the red blood cells and creates a natural anemia-like situation. What are the causes? The most common cause of anemia during pregnancy is not having enough iron in the body to make red blood cells (iron deficiency anemia). Other causes may include:  Folic acid deficiency.  Vitamin B12 deficiency.  Certain prescription or over-the-counter medicines.  Certain medical conditions or infections that destroy red blood cells.  A low platelet count and bleeding caused by antibodies that go through the placenta to the fetus from the mother's blood. What are the signs or symptoms? Mild anemia may not be noticeable. If it becomes severe, symptoms may include:  Tiredness.  Shortness of breath, especially with exercise.  Weakness.  Fainting.  Pale looking skin.  Headaches.  Feeling a fast or irregular heartbeat (palpitations). How is this diagnosed? The type of anemia is usually diagnosed from your family and medical history and blood tests. How is this treated? Treatment of anemia during pregnancy depends on the cause of the anemia. Treatment can include:  Supplements of iron, vitamin B12, or folic acid.  A blood transfusion. This may be needed if blood loss is severe.  Hospitalization. This may be needed if there is significant continual blood loss.  Dietary changes. Follow  these instructions at home:  Follow your dietitian's or health care provider's dietary recommendations.  Increase your vitamin C intake. This will help the stomach absorb more iron.  Eat a diet rich in iron. This would include foods such as:  Liver.  Beef.  Whole grain bread.  Eggs.  Dried fruit.  Take iron and vitamins as directed by your health care provider.  Eat green leafy vegetables. These are a good source of folic acid. Contact a health care provider if:  You have frequent or lasting headaches.  You are looking pale.  You are bruising easily. Get help right away if:  You have extreme weakness, shortness of breath, or chest pain.  You become dizzy or have trouble concentrating.  You have heavy vaginal bleeding.  You develop a rash.  You have bloody or black, tarry stools.  You faint.  You vomit up blood.  You vomit repeatedly.  You have abdominal pain.  You have a fever or persistent symptoms for more than 2-3 days.  You have a fever and your symptoms suddenly get worse.  You are dehydrated. This information is not intended to replace advice given to you by your health care provider. Make sure you discuss any questions you have with your health care provider. Document Released: 10/10/2000 Document Revised: 03/20/2016 Document Reviewed: 05/25/2013 Elsevier Interactive Patient Education  2017 Elsevier Inc.  

## 2018-08-13 NOTE — Progress Notes (Signed)
Subjective:  Rebecca Woodward is a 17 y.o. G1P0 at [redacted]w[redacted]d being seen today for ongoing prenatal care.  She is currently monitored for the following issues for this low-risk pregnancy and has Supervision of normal first pregnancy, antepartum; Attention deficit hyperactivity disorder (ADHD); Epilepsy (HCC); Obesity in pregnancy; and Anemia in pregnancy on their problem list.  Patient reports no complaints.  Contractions: Not present. Vag. Bleeding: None.  Movement: Present. Denies leaking of fluid.   The following portions of the patient's history were reviewed and updated as appropriate: allergies, current medications, past family history, past medical history, past social history, past surgical history and problem list. Problem list updated.  Objective:   Vitals:   08/13/18 0801  BP: 122/74  Pulse: 83  Weight: 103 kg    Fetal Status: Fetal Heart Rate (bpm): 134 Fundal Height: 35 cm Movement: Present     General:  Alert, oriented and cooperative. Patient is in no acute distress.  Skin: Skin is warm and dry. No rash noted.   Cardiovascular: Normal heart rate noted  Respiratory: Normal respiratory effort, no problems with respiration noted  Abdomen: Soft, gravid, appropriate for gestational age. Pain/Pressure: Absent     Pelvic: Vag. Bleeding: None Vag D/C Character: Thin   Cervical exam deferred        Extremities: Normal range of motion.  Edema: None  Mental Status: Normal mood and affect. Normal behavior. Normal judgment and thought content.   Urinalysis:      Assessment and Plan:  Pregnancy: G1P0 at [redacted]w[redacted]d  1. Supervision of normal first pregnancy, antepartum - Third trimester anticipatory guidance - recommend OTC Fe once daily or increase Fe rich foods for anemia  Preterm labor symptoms and general obstetric precautions including but not limited to vaginal bleeding, contractions, leaking of fluid and fetal movement were reviewed in detail with the patient. Please refer to After  Visit Summary for other counseling recommendations.  Return in about 1 week (around 08/20/2018).   Donette Larry, CNM

## 2018-08-20 ENCOUNTER — Ambulatory Visit (INDEPENDENT_AMBULATORY_CARE_PROVIDER_SITE_OTHER): Payer: Medicaid Other | Admitting: Certified Nurse Midwife

## 2018-08-20 ENCOUNTER — Other Ambulatory Visit (HOSPITAL_COMMUNITY)
Admission: RE | Admit: 2018-08-20 | Discharge: 2018-08-20 | Disposition: A | Discharge status: Home / Self Care | Payer: Medicaid Other | Source: Ambulatory Visit | Attending: Certified Nurse Midwife | Admitting: Certified Nurse Midwife

## 2018-08-20 VITALS — BP 117/70 | HR 95 | Wt 228.0 lb

## 2018-08-20 DIAGNOSIS — Z3403 Encounter for supervision of normal first pregnancy, third trimester: Secondary | ICD-10-CM | POA: Insufficient documentation

## 2018-08-20 DIAGNOSIS — Z3400 Encounter for supervision of normal first pregnancy, unspecified trimester: Secondary | ICD-10-CM

## 2018-08-20 LAB — OB RESULTS CONSOLE GBS: GBS: POSITIVE

## 2018-08-20 LAB — OB RESULTS CONSOLE GC/CHLAMYDIA: Gonorrhea: NEGATIVE

## 2018-08-20 NOTE — Progress Notes (Signed)
Subjective:  Rebecca Woodward is a 17 y.o. G1P0 at [redacted]w[redacted]d being seen today for ongoing prenatal care.  She is currently monitored for the following issues for this low-risk pregnancy and has Supervision of normal first pregnancy, antepartum; Attention deficit hyperactivity disorder (ADHD); Epilepsy (HCC); Obesity in pregnancy; and Anemia in pregnancy on their problem list.  Patient reports no complaints.  Contractions: Not present. Vag. Bleeding: None.  Movement: Present. Denies leaking of fluid. Denies urinary sx.  The following portions of the patient's history were reviewed and updated as appropriate: allergies, current medications, past family history, past medical history, past social history, past surgical history and problem list. Problem list updated.  Objective:   Vitals:   08/20/18 0839  BP: 117/70  Pulse: 95  Weight: 103.4 kg    Fetal Status: Fetal Heart Rate (bpm): 136 Fundal Height: 36 cm Movement: Present  Presentation: Vertex  General:  Alert, oriented and cooperative. Patient is in no acute distress.  Skin: Skin is warm and dry. No rash noted.   Cardiovascular: Normal heart rate noted  Respiratory: Normal respiratory effort, no problems with respiration noted  Abdomen: Soft, gravid, appropriate for gestational age. Pain/Pressure: Absent     Pelvic: Vag. Bleeding: None Vag D/C Character: Thin   Cervical exam deferred        Extremities: Normal range of motion.  Edema: None  Mental Status: Normal mood and affect. Normal behavior. Normal judgment and thought content.   Urinalysis:      Assessment and Plan:  Pregnancy: G1P0 at [redacted]w[redacted]d  1. Encounter for supervision of normal first pregnancy in third trimester - Culture, beta strep (group b only) - Culture, OB Urine - Urine cytology ancillary only([facility])  Preterm labor symptoms and general obstetric precautions including but not limited to vaginal bleeding, contractions, leaking of fluid and fetal movement were  reviewed in detail with the patient. Please refer to After Visit Summary for other counseling recommendations.  Return in about 1 week (around 08/27/2018).   Donette Larry, CNM

## 2018-08-20 NOTE — Progress Notes (Signed)
Urine is positive for Nitrites Culture sent

## 2018-08-22 LAB — CULTURE, BETA STREP (GROUP B ONLY)
MICRO NUMBER:: 91286431
SPECIMEN QUALITY:: ADEQUATE

## 2018-08-23 LAB — URINE CULTURE, OB REFLEX

## 2018-08-23 LAB — CULTURE, OB URINE

## 2018-08-24 LAB — URINE CYTOLOGY ANCILLARY ONLY
Chlamydia: NEGATIVE
Neisseria Gonorrhea: NEGATIVE

## 2018-08-27 ENCOUNTER — Ambulatory Visit (INDEPENDENT_AMBULATORY_CARE_PROVIDER_SITE_OTHER): Payer: Medicaid Other | Admitting: Certified Nurse Midwife

## 2018-08-27 ENCOUNTER — Encounter: Payer: Self-pay | Admitting: Certified Nurse Midwife

## 2018-08-27 VITALS — BP 117/71 | HR 82 | Wt 227.0 lb

## 2018-08-27 DIAGNOSIS — Z3400 Encounter for supervision of normal first pregnancy, unspecified trimester: Secondary | ICD-10-CM

## 2018-08-27 DIAGNOSIS — N3000 Acute cystitis without hematuria: Secondary | ICD-10-CM

## 2018-08-27 DIAGNOSIS — O99820 Streptococcus B carrier state complicating pregnancy: Secondary | ICD-10-CM | POA: Insufficient documentation

## 2018-08-27 MED ORDER — CEPHALEXIN 500 MG PO CAPS: 500.0000 mg | ORAL_CAPSULE | Freq: Four times a day (QID) | ORAL | 0 refills | Status: DC

## 2018-08-27 NOTE — Progress Notes (Signed)
Subjective:  Rebecca Woodward is a 17 y.o. G1P0 at [redacted]w[redacted]d being seen today for ongoing prenatal care.  She is currently monitored for the following issues for this low-risk pregnancy and has Supervision of normal first pregnancy, antepartum; Attention deficit hyperactivity disorder (ADHD); Epilepsy (HCC); Obesity in pregnancy; and Anemia in pregnancy on their problem list.  Patient reports no complaints.  Contractions: Not present. Vag. Bleeding: None.  Movement: Present. Denies leaking of fluid.   The following portions of the patient's history were reviewed and updated as appropriate: allergies, current medications, past family history, past medical history, past social history, past surgical history and problem list. Problem list updated.  Objective:   Vitals:   08/27/18 0922  BP: 117/71  Pulse: 82  Weight: 103 kg    Fetal Status: Fetal Heart Rate (bpm): 130 Fundal Height: 37 cm Movement: Present  Presentation: Vertex  General:  Alert, oriented and cooperative. Patient is in no acute distress.  Skin: Skin is warm and dry. No rash noted.   Cardiovascular: Normal heart rate noted  Respiratory: Normal respiratory effort, no problems with respiration noted  Abdomen: Soft, gravid, appropriate for gestational age. Pain/Pressure: Absent     Pelvic: Vag. Bleeding: None Vag D/C Character: Thin   Cervical exam deferred        Extremities: Normal range of motion.  Edema: Trace  Mental Status: Normal mood and affect. Normal behavior. Normal judgment and thought content.   Urinalysis:      Assessment and Plan:  Pregnancy: G1P0 at [redacted]w[redacted]d  1. Supervision of normal first pregnancy, antepartum  2. Acute cystitis without hematuria - had sx last week, UC showed e.coli - cephALEXin (KEFLEX) 500 MG capsule; Take 1 capsule (500 mg total) by mouth 4 (four) times daily for 7 days.  Dispense: 28 capsule; Refill: 0  Term labor symptoms and general obstetric precautions including but not limited to  vaginal bleeding, contractions, leaking of fluid and fetal movement were reviewed in detail with the patient. Please refer to After Visit Summary for other counseling recommendations.  Return in about 1 week (around 09/03/2018).   Donette Larry, CNM

## 2018-08-30 ENCOUNTER — Telehealth: Payer: Self-pay

## 2018-08-30 DIAGNOSIS — N3000 Acute cystitis without hematuria: Secondary | ICD-10-CM

## 2018-08-30 MED ORDER — CEPHALEXIN 500 MG PO CAPS: 500.0000 mg | ORAL_CAPSULE | Freq: Four times a day (QID) | ORAL | 0 refills | Status: AC

## 2018-08-30 NOTE — Telephone Encounter (Signed)
PT's mother called stating that she needed the pt's Rx for Keflex sent to the pharmacy again because they originally told the pharmacy to discard the Rx because she wasn't going to take the medication but, now she wants to take it.

## 2018-09-03 ENCOUNTER — Ambulatory Visit (INDEPENDENT_AMBULATORY_CARE_PROVIDER_SITE_OTHER): Payer: Medicaid Other | Admitting: Student

## 2018-09-03 DIAGNOSIS — Z3400 Encounter for supervision of normal first pregnancy, unspecified trimester: Secondary | ICD-10-CM

## 2018-09-03 DIAGNOSIS — N390 Urinary tract infection, site not specified: Secondary | ICD-10-CM | POA: Insufficient documentation

## 2018-09-03 DIAGNOSIS — B379 Candidiasis, unspecified: Secondary | ICD-10-CM | POA: Insufficient documentation

## 2018-09-03 DIAGNOSIS — N3000 Acute cystitis without hematuria: Secondary | ICD-10-CM

## 2018-09-03 MED ORDER — TERCONAZOLE 0.4 % VA CREA
1.0000 | TOPICAL_CREAM | Freq: Every day | VAGINAL | 0 refills | Status: DC
Start: 2018-09-03 — End: 2018-09-10

## 2018-09-03 NOTE — Progress Notes (Signed)
Patient ID: Rebecca Woodward, female   DOB: 24-Nov-2000, 17 y.o.   MRN: [id number]   PRENATAL VISIT NOTE  Subjective:  Rebecca Woodward is a 17 y.o. G1P0 at [redacted]w[redacted]d being seen today for ongoing prenatal care.  She is currently monitored for the following issues for this low-risk pregnancy and has Supervision of normal first pregnancy, antepartum; Attention deficit hyperactivity disorder (ADHD); Epilepsy (HCC); Obesity in pregnancy; Anemia in pregnancy; GBS (group B Streptococcus carrier), +RV culture, currently pregnant; UTI (urinary tract infection); and Yeast infection on their problem list.  Patient reports heavier discharge last night twice; it happened after she urinated. She thought maybe she didn't wipe well enough. She did not have to change her clothes or wear a pad. Her mother told her to wait and see her provider today.  Has also felt some irritation in her vulva. She just started her UTI medicine Tuesday night. . The irritation started yesterday.   Contractions: Not present. Vag. Bleeding: None.  Movement: Present. Denies leaking of fluid.   The following portions of the patient's history were reviewed and updated as appropriate: allergies, current medications, past family history, past medical history, past social history, past surgical history and problem list. Problem list updated.  Objective:   Vitals:   09/03/18 1020  BP: 123/77  Pulse: 76  Weight: 230 lb (104.3 kg)    Fetal Status: Fetal Heart Rate (bpm): 129 Fundal Height: 38 cm Movement: Present     General:  Alert, oriented and cooperative. Patient is in no acute distress.  Skin: Skin is warm and dry. No rash noted.   Cardiovascular: Normal heart rate noted  Respiratory: Normal respiratory effort, no problems with respiration noted  Abdomen: Soft, gravid, appropriate for gestational age.  Pain/Pressure: Absent     Pelvic: Cervical exam deferred       Reddened vaginal walls; clumpy white discharge. No pooling.     Extremities: Normal range of motion.  Edema: Trace  Mental Status: Normal mood and affect. Normal behavior. Normal judgment and thought content.   Assessment and Plan:  Pregnancy: G1P0 at [redacted]w[redacted]d  1. Supervision of normal first pregnancy, antepartum -Physical indicates yeast infection, negative nitrazine and ferning and low suspicion for SROM.  -Will send terazole for yeast, continue keflex 4 times.   Term labor symptoms and general obstetric precautions including but not limited to vaginal bleeding, contractions, leaking of fluid and fetal movement were reviewed in detail with the patient. Please refer to After Visit Summary for other counseling recommendations.  Return in about 1 week (around 09/10/2018), or LROB.  No future appointments.  Marylene Land, CNM

## 2018-09-03 NOTE — Patient Instructions (Signed)

## 2018-09-03 NOTE — Progress Notes (Signed)
r 

## 2018-09-03 NOTE — Progress Notes (Signed)
Pt states she "got up from using the restroom yesterday and noticed liquid running down her leg but not sure if it was urine or not".

## 2018-09-10 ENCOUNTER — Ambulatory Visit (INDEPENDENT_AMBULATORY_CARE_PROVIDER_SITE_OTHER): Payer: Medicaid Other

## 2018-09-10 VITALS — BP 124/83 | HR 77 | Wt 230.0 lb

## 2018-09-10 DIAGNOSIS — Z3400 Encounter for supervision of normal first pregnancy, unspecified trimester: Secondary | ICD-10-CM

## 2018-09-10 DIAGNOSIS — Z3A39 39 weeks gestation of pregnancy: Secondary | ICD-10-CM

## 2018-09-10 DIAGNOSIS — O368130 Decreased fetal movements, third trimester, not applicable or unspecified: Secondary | ICD-10-CM | POA: Diagnosis not present

## 2018-09-10 DIAGNOSIS — B951 Streptococcus, group B, as the cause of diseases classified elsewhere: Secondary | ICD-10-CM

## 2018-09-10 DIAGNOSIS — O98819 Other maternal infectious and parasitic diseases complicating pregnancy, unspecified trimester: Secondary | ICD-10-CM

## 2018-09-10 DIAGNOSIS — O98813 Other maternal infectious and parasitic diseases complicating pregnancy, third trimester: Secondary | ICD-10-CM

## 2018-09-10 NOTE — Patient Instructions (Signed)
Safe Medications in Pregnancy   Acne: Benzoyl Peroxide Salicylic Acid  Backache/Headache: Tylenol: 2 regular strength every 4 hours OR              2 Extra strength every 6 hours  Colds/Coughs/Allergies: Benadryl (alcohol free) 25 mg every 6 hours as needed Breath right strips Claritin Cepacol throat lozenges Chloraseptic throat spray Cold-Eeze- up to three times per day Cough drops, alcohol free Flonase (by prescription only) Guaifenesin Mucinex Robitussin DM (plain only, alcohol free) Saline nasal spray/drops Sudafed (pseudoephedrine) & Actifed ** use only after [redacted] weeks gestation and if you do not have high blood pressure Tylenol Vicks Vaporub Zinc lozenges Zyrtec   Constipation: Colace Ducolax suppositories Fleet enema Glycerin suppositories Metamucil Milk of magnesia Miralax Senokot Smooth move tea  Diarrhea: Kaopectate Imodium A-D  *NO pepto Bismol  Hemorrhoids: Anusol Anusol HC Preparation H Tucks  Indigestion: Tums Maalox Mylanta Zantac  Pepcid  Insomnia: Benadryl (alcohol free) 25mg every 6 hours as needed Tylenol PM Unisom, no Gelcaps  Leg Cramps: Tums MagGel  Nausea/Vomiting:  Bonine Dramamine Emetrol Ginger extract Sea bands Meclizine  Nausea medication to take during pregnancy:  Unisom (doxylamine succinate 25 mg tablets) Take one tablet daily at bedtime. If symptoms are not adequately controlled, the dose can be increased to a maximum recommended dose of two tablets daily (1/2 tablet in the morning, 1/2 tablet mid-afternoon and one at bedtime). Vitamin B6 100mg tablets. Take one tablet twice a day (up to 200 mg per day).  Skin Rashes: Aveeno products Benadryl cream or 25mg every 6 hours as needed Calamine Lotion 1% cortisone cream  Yeast infection: Gyne-lotrimin 7 Monistat 7   **If taking multiple medications, please check labels to avoid duplicating the same active ingredients **take medication as directed on  the label ** Do not exceed 4000 mg of tylenol in 24 hours **Do not take medications that contain aspirin or ibuprofen     Braxton Hicks Contractions Contractions of the uterus can occur throughout pregnancy, but they are not always a sign that you are in labor. You may have practice contractions called Braxton Hicks contractions. These false labor contractions are sometimes confused with true labor. What are Braxton Hicks contractions? Braxton Hicks contractions are tightening movements that occur in the muscles of the uterus before labor. Unlike true labor contractions, these contractions do not result in opening (dilation) and thinning of the cervix. Toward the end of pregnancy (32-34 weeks), Braxton Hicks contractions can happen more often and may become stronger. These contractions are sometimes difficult to tell apart from true labor because they can be very uncomfortable. You should not feel embarrassed if you go to the hospital with false labor. Sometimes, the only way to tell if you are in true labor is for your health care provider to look for changes in the cervix. The health care provider will do a physical exam and may monitor your contractions. If you are not in true labor, the exam should show that your cervix is not dilating and your water has not broken. If there are other health problems associated with your pregnancy, it is completely safe for you to be sent home with false labor. You may continue to have Braxton Hicks contractions until you go into true labor. How to tell the difference between true labor and false labor True labor  Contractions last 30-70 seconds.  Contractions become very regular.  Discomfort is usually felt in the top of the uterus, and it spreads to the lower   abdomen and low back.  Contractions do not go away with walking.  Contractions usually become more intense and increase in frequency.  The cervix dilates and gets thinner. False  labor  Contractions are usually shorter and not as strong as true labor contractions.  Contractions are usually irregular.  Contractions are often felt in the front of the lower abdomen and in the groin.  Contractions may go away when you walk around or change positions while lying down.  Contractions get weaker and are shorter-lasting as time goes on.  The cervix usually does not dilate or become thin. Follow these instructions at home:  Take over-the-counter and prescription medicines only as told by your health care provider.  Keep up with your usual exercises and follow other instructions from your health care provider.  Eat and drink lightly if you think you are going into labor.  If Braxton Hicks contractions are making you uncomfortable: ? Change your position from lying down or resting to walking, or change from walking to resting. ? Sit and rest in a tub of warm water. ? Drink enough fluid to keep your urine pale yellow. Dehydration may cause these contractions. ? Do slow and deep breathing several times an hour.  Keep all follow-up prenatal visits as told by your health care provider. This is important. Contact a health care provider if:  You have a fever.  You have continuous pain in your abdomen. Get help right away if:  Your contractions become stronger, more regular, and closer together.  You have fluid leaking or gushing from your vagina.  You pass blood-tinged mucus (bloody show).  You have bleeding from your vagina.  You have low back pain that you never had before.  You feel your baby's head pushing down and causing pelvic pressure.  Your baby is not moving inside you as much as it used to. Summary  Contractions that occur before labor are called Braxton Hicks contractions, false labor, or practice contractions.  Braxton Hicks contractions are usually shorter, weaker, farther apart, and less regular than true labor contractions. True labor  contractions usually become progressively stronger and regular and they become more frequent.  [facility] contractions by changing position, resting in a warm bath, drinking plenty of water, or practicing deep breathing. This information is not intended to replace advice given to you by your health care provider. Make sure you discuss any questions you have with your health care provider. Document Released: 02/26/2017 Document Revised: 02/26/2017 Document Reviewed: 02/26/2017 Elsevier Interactive Patient Education  2018 [employer]

## 2018-09-10 NOTE — Progress Notes (Signed)
   PRENATAL VISIT NOTE  Subjective:  Rebecca Woodward is a 17 y.o. G1P0 at 562w2d being seen today for ongoing prenatal care.  She is currently monitored for the following issues for this low-risk pregnancy and has Supervision of normal first pregnancy, antepartum; Attention deficit hyperactivity disorder (ADHD); Epilepsy (HCC); Obesity in pregnancy; Anemia in pregnancy; GBS (group B Streptococcus carrier), +RV culture, currently pregnant; UTI (urinary tract infection); and Yeast infection on their problem list.  Patient reports decreased fetal movement today.  Contractions: Not present. Vag. Bleeding: None.  Movement: Present. Denies leaking of fluid.   The following portions of the patient's history were reviewed and updated as appropriate: allergies, current medications, past family history, past medical history, past social history, past surgical history and problem list. Problem list updated.  Objective:   Vitals:   09/10/18 0811  BP: 124/83  Pulse: 77  Weight: 230 lb (104.3 kg)    Fetal Status:     Movement: Present     General:  Alert, oriented and cooperative. Patient is in no acute distress.  Skin: Skin is warm and dry. No rash noted.   Cardiovascular: Normal heart rate noted  Respiratory: Normal respiratory effort, no problems with respiration noted  Abdomen: Soft, gravid, appropriate for gestational age.  Pain/Pressure: Absent     Pelvic: Cervical exam deferred        Extremities: Normal range of motion.  Edema: Trace  Mental Status: Normal mood and affect. Normal behavior. Normal judgment and thought content.   Assessment and Plan:  Pregnancy: G1P0 at 8362w2d  1. Supervision of normal first pregnancy, antepartum -NST reactive, baseline 120bpm, moderate variability, 15x15 accels and no decels - IOL methods reviewed with patient  2. Group B streptococcal infection during pregnancy -Will treat during labor  Term labor symptoms and general obstetric precautions including  but not limited to vaginal bleeding, contractions, leaking of fluid and fetal movement were reviewed in detail with the patient. Please refer to After Visit Summary for other counseling recommendations.  Return in about 1 week (around 09/17/2018) for Return OB visit.  Rolm BookbinderCaroline M Shaterra Sanzone, CNM  09/10/18 8:17 AM

## 2018-09-13 ENCOUNTER — Encounter: Payer: Medicaid Other | Admitting: Obstetrics & Gynecology

## 2018-09-20 ENCOUNTER — Encounter (HOSPITAL_COMMUNITY): Payer: Self-pay | Admitting: *Deleted

## 2018-09-20 ENCOUNTER — Telehealth (HOSPITAL_COMMUNITY): Payer: Self-pay | Admitting: *Deleted

## 2018-09-20 ENCOUNTER — Ambulatory Visit (INDEPENDENT_AMBULATORY_CARE_PROVIDER_SITE_OTHER): Payer: Medicaid Other | Admitting: Family Medicine

## 2018-09-20 VITALS — BP 133/76 | Wt 228.0 lb

## 2018-09-20 DIAGNOSIS — O98819 Other maternal infectious and parasitic diseases complicating pregnancy, unspecified trimester: Secondary | ICD-10-CM

## 2018-09-20 DIAGNOSIS — B951 Streptococcus, group B, as the cause of diseases classified elsewhere: Secondary | ICD-10-CM

## 2018-09-20 DIAGNOSIS — O98813 Other maternal infectious and parasitic diseases complicating pregnancy, third trimester: Secondary | ICD-10-CM

## 2018-09-20 DIAGNOSIS — Z3403 Encounter for supervision of normal first pregnancy, third trimester: Secondary | ICD-10-CM

## 2018-09-20 DIAGNOSIS — Z3A40 40 weeks gestation of pregnancy: Secondary | ICD-10-CM

## 2018-09-20 DIAGNOSIS — Z3400 Encounter for supervision of normal first pregnancy, unspecified trimester: Secondary | ICD-10-CM

## 2018-09-20 NOTE — Progress Notes (Signed)
   PRENATAL VISIT NOTE  Subjective:  Rebecca Woodward is a 17 y.o. G1P0 at 4416w5d being seen today for ongoing prenatal care.  She is currently monitored for the following issues for this low-risk pregnancy and has Supervision of normal first pregnancy, antepartum; Attention deficit hyperactivity disorder (ADHD); Epilepsy (HCC); Obesity in pregnancy; Anemia in pregnancy; GBS (group B Streptococcus carrier), +RV culture, currently pregnant; UTI (urinary tract infection); and Yeast infection on their problem list.  Patient reports no complaints.  Contractions: Irritability. Vag. Bleeding: None.  Movement: Present. Denies leaking of fluid.   The following portions of the patient's history were reviewed and updated as appropriate: allergies, current medications, past family history, past medical history, past social history, past surgical history and problem list. Problem list updated.  Objective:   Vitals:   09/20/18 0802  BP: 133/76  Weight: 228 lb (103.4 kg)    Fetal Status: Fetal Heart Rate (bpm): 133 Fundal Height: 39 cm Movement: Present  Presentation: Vertex  General:  Alert, oriented and cooperative. Patient is in no acute distress.  Skin: Skin is warm and dry. No rash noted.   Cardiovascular: Normal heart rate noted  Respiratory: Normal respiratory effort, no problems with respiration noted  Abdomen: Soft, gravid, appropriate for gestational age.  Pain/Pressure: Present     Pelvic: Cervical exam performed Dilation: 1.5 Effacement (%): 70 Station: -3  Extremities: Normal range of motion.  Edema: Trace  Mental Status: Normal mood and affect. Normal behavior. Normal judgment and thought content.   Assessment and Plan:  Pregnancy: G1P0 at 1816w5d  1. Supervision of normal first pregnancy, antepartum FHT and FH normal. Induction scheduled for tonight. Discussed postplacental IUD - patient contemplating.  2. Group B streptococcal infection during pregnancy Intrapartum IUD.  Term  labor symptoms and general obstetric precautions including but not limited to vaginal bleeding, contractions, leaking of fluid and fetal movement were reviewed in detail with the patient. Please refer to After Visit Summary for other counseling recommendations.  Return in about 4 weeks (around 10/18/2018) for Postpartum Exam.  Future Appointments  Date Time Provider Department Center  09/21/2018 12:00 AM [facility] None    Levie HeritageJacob J , DO

## 2018-09-20 NOTE — Telephone Encounter (Signed)
Preadmission screen  

## 2018-09-21 ENCOUNTER — Other Ambulatory Visit: Payer: Self-pay

## 2018-09-21 ENCOUNTER — Encounter (HOSPITAL_COMMUNITY): Payer: Self-pay

## 2018-09-21 ENCOUNTER — Inpatient Hospital Stay (HOSPITAL_COMMUNITY)
Admission: RE | Admit: 2018-09-21 | Discharge: 2018-09-24 | DRG: 807 | Disposition: A | Discharge status: Home / Self Care | Payer: Medicaid Other | Attending: Family Medicine | Admitting: Family Medicine

## 2018-09-21 ENCOUNTER — Inpatient Hospital Stay (HOSPITAL_COMMUNITY): Payer: Medicaid Other | Admitting: Anesthesiology

## 2018-09-21 DIAGNOSIS — F909 Attention-deficit hyperactivity disorder, unspecified type: Secondary | ICD-10-CM | POA: Diagnosis present

## 2018-09-21 DIAGNOSIS — O99214 Obesity complicating childbirth: Secondary | ICD-10-CM | POA: Diagnosis present

## 2018-09-21 DIAGNOSIS — Z3A40 40 weeks gestation of pregnancy: Secondary | ICD-10-CM | POA: Diagnosis not present

## 2018-09-21 DIAGNOSIS — O99350 Diseases of the nervous system complicating pregnancy, unspecified trimester: Secondary | ICD-10-CM | POA: Diagnosis present

## 2018-09-21 DIAGNOSIS — O9902 Anemia complicating childbirth: Secondary | ICD-10-CM | POA: Diagnosis present

## 2018-09-21 DIAGNOSIS — Z3400 Encounter for supervision of normal first pregnancy, unspecified trimester: Secondary | ICD-10-CM

## 2018-09-21 DIAGNOSIS — E669 Obesity, unspecified: Secondary | ICD-10-CM | POA: Diagnosis present

## 2018-09-21 DIAGNOSIS — O480 Post-term pregnancy: Principal | ICD-10-CM | POA: Diagnosis present

## 2018-09-21 DIAGNOSIS — O99210 Obesity complicating pregnancy, unspecified trimester: Secondary | ICD-10-CM | POA: Diagnosis present

## 2018-09-21 DIAGNOSIS — D649 Anemia, unspecified: Secondary | ICD-10-CM | POA: Diagnosis present

## 2018-09-21 DIAGNOSIS — O326XX0 Maternal care for compound presentation, not applicable or unspecified: Secondary | ICD-10-CM | POA: Diagnosis present

## 2018-09-21 DIAGNOSIS — G40909 Epilepsy, unspecified, not intractable, without status epilepticus: Secondary | ICD-10-CM

## 2018-09-21 DIAGNOSIS — O99019 Anemia complicating pregnancy, unspecified trimester: Secondary | ICD-10-CM | POA: Diagnosis present

## 2018-09-21 DIAGNOSIS — Z3490 Encounter for supervision of normal pregnancy, unspecified, unspecified trimester: Secondary | ICD-10-CM | POA: Diagnosis present

## 2018-09-21 DIAGNOSIS — N390 Urinary tract infection, site not specified: Secondary | ICD-10-CM | POA: Diagnosis present

## 2018-09-21 DIAGNOSIS — O99820 Streptococcus B carrier state complicating pregnancy: Secondary | ICD-10-CM

## 2018-09-21 DIAGNOSIS — O99824 Streptococcus B carrier state complicating childbirth: Secondary | ICD-10-CM | POA: Diagnosis present

## 2018-09-21 LAB — CBC
HCT: 29.5 % — ABNORMAL LOW (ref 36.0–46.0)
Hemoglobin: 9.4 g/dL — ABNORMAL LOW (ref 12.0–15.0)
MCH: 25.5 pg — ABNORMAL LOW (ref 26.0–34.0)
MCHC: 31.9 g/dL (ref 30.0–36.0)
MCV: 79.9 fL — ABNORMAL LOW (ref 80.0–100.0)
Platelets: 329 10*3/uL (ref 150–400)
RBC: 3.69 MIL/uL — ABNORMAL LOW (ref 3.87–5.11)
RDW: 16.4 % — ABNORMAL HIGH (ref 11.5–15.5)
WBC: 9.6 10*3/uL (ref 4.0–10.5)

## 2018-09-21 LAB — TYPE AND SCREEN
ABO/RH(D): A POS
Antibody Screen: NEGATIVE

## 2018-09-21 LAB — ABO/RH: ABO/RH(D): A POS

## 2018-09-21 LAB — RPR: RPR Ser Ql: NONREACTIVE

## 2018-09-21 MED ORDER — MISOPROSTOL 25 MCG QUARTER TABLET
25.0000 ug | ORAL_TABLET | ORAL | Status: DC | PRN
  Administered 2018-09-21 (×2): 25 ug via VAGINAL
  Filled 2018-09-21 (×2): qty 1

## 2018-09-21 MED ORDER — FENTANYL CITRATE (PF) 100 MCG/2ML IJ SOLN
100.0000 ug | INTRAMUSCULAR | Status: DC | PRN
  Administered 2018-09-21 (×2): 100 ug via INTRAVENOUS
  Filled 2018-09-21 (×3): qty 2

## 2018-09-21 MED ORDER — EPHEDRINE 5 MG/ML INJ
10.0000 mg | INTRAVENOUS | Status: DC | PRN
  Filled 2018-09-21: qty 2

## 2018-09-21 MED ORDER — LIDOCAINE HCL (PF) 1 % IJ SOLN
INTRAMUSCULAR | Status: DC | PRN
  Administered 2018-09-21: 4 mL via EPIDURAL
  Administered 2018-09-21: 10 mL via EPIDURAL
  Administered 2018-09-21: 4 mL via EPIDURAL

## 2018-09-21 MED ORDER — BUPIVACAINE HCL (PF) 0.25 % IJ SOLN
INTRAMUSCULAR | Status: DC | PRN
  Administered 2018-09-21: 8 mL via EPIDURAL
  Administered 2018-09-21: 7 mL via EPIDURAL

## 2018-09-21 MED ORDER — PHENYLEPHRINE 40 MCG/ML (10ML) SYRINGE FOR IV PUSH (FOR BLOOD PRESSURE SUPPORT)
80.0000 ug | PREFILLED_SYRINGE | INTRAVENOUS | Status: DC | PRN
  Filled 2018-09-21: qty 5
  Filled 2018-09-21: qty 10

## 2018-09-21 MED ORDER — FENTANYL CITRATE (PF) 100 MCG/2ML IJ SOLN
INTRAMUSCULAR | Status: DC | PRN
  Administered 2018-09-21: 100 ug via EPIDURAL

## 2018-09-21 MED ORDER — LACTATED RINGERS IV SOLN
500.0000 mL | Freq: Once | INTRAVENOUS | Status: AC
  Administered 2018-09-21: 500 mL via INTRAVENOUS

## 2018-09-21 MED ORDER — TERBUTALINE SULFATE 1 MG/ML IJ SOLN
0.2500 mg | Freq: Once | INTRAMUSCULAR | Status: DC | PRN
  Filled 2018-09-21: qty 1

## 2018-09-21 MED ORDER — PHENYLEPHRINE 40 MCG/ML (10ML) SYRINGE FOR IV PUSH (FOR BLOOD PRESSURE SUPPORT)
80.0000 ug | PREFILLED_SYRINGE | INTRAVENOUS | Status: DC | PRN
  Filled 2018-09-21: qty 5

## 2018-09-21 MED ORDER — SODIUM BICARBONATE 8.4 % IV SOLN
INTRAVENOUS | Status: DC | PRN
  Administered 2018-09-21: 6 mL via EPIDURAL

## 2018-09-21 MED ORDER — LACTATED RINGERS IV SOLN
INTRAVENOUS | Status: DC
  Administered 2018-09-21 – 2018-09-22 (×5): via INTRAVENOUS

## 2018-09-21 MED ORDER — SODIUM CHLORIDE 0.9 % IV SOLN
5.0000 10*6.[IU] | Freq: Once | INTRAVENOUS | Status: AC
  Administered 2018-09-21: 5 10*6.[IU] via INTRAVENOUS
  Filled 2018-09-21: qty 5

## 2018-09-21 MED ORDER — MISOPROSTOL 50MCG HALF TABLET
50.0000 ug | ORAL_TABLET | ORAL | Status: DC | PRN
  Administered 2018-09-21: 50 ug via ORAL
  Filled 2018-09-21 (×2): qty 1

## 2018-09-21 MED ORDER — OXYCODONE-ACETAMINOPHEN 5-325 MG PO TABS: 2.0000 | ORAL_TABLET | ORAL | Status: DC | PRN

## 2018-09-21 MED ORDER — OXYTOCIN 40 UNITS IN LACTATED RINGERS INFUSION - SIMPLE MED
2.5000 [IU]/h | INTRAVENOUS | Status: DC
  Administered 2018-09-22: 2.5 [IU]/h via INTRAVENOUS

## 2018-09-21 MED ORDER — ONDANSETRON HCL 4 MG/2ML IJ SOLN: 4.0000 mg | Freq: Four times a day (QID) | INTRAMUSCULAR | Status: DC | PRN

## 2018-09-21 MED ORDER — DIPHENHYDRAMINE HCL 50 MG/ML IJ SOLN: 12.5000 mg | INTRAMUSCULAR | Status: DC | PRN

## 2018-09-21 MED ORDER — ACETAMINOPHEN 325 MG PO TABS: 650.0000 mg | ORAL_TABLET | ORAL | Status: DC | PRN

## 2018-09-21 MED ORDER — OXYTOCIN BOLUS FROM INFUSION
500.0000 mL | Freq: Once | INTRAVENOUS | Status: AC
  Administered 2018-09-22: 500 mL via INTRAVENOUS

## 2018-09-21 MED ORDER — OXYCODONE-ACETAMINOPHEN 5-325 MG PO TABS: 1.0000 | ORAL_TABLET | ORAL | Status: DC | PRN

## 2018-09-21 MED ORDER — LIDOCAINE HCL (PF) 1 % IJ SOLN
30.0000 mL | INTRAMUSCULAR | Status: DC | PRN
  Filled 2018-09-21: qty 30

## 2018-09-21 MED ORDER — FENTANYL 2.5 MCG/ML BUPIVACAINE 1/10 % EPIDURAL INFUSION (WH - ANES)
14.0000 mL/h | INTRAMUSCULAR | Status: DC | PRN
  Administered 2018-09-21 – 2018-09-22 (×5): 14 mL/h via EPIDURAL
  Filled 2018-09-21 (×5): qty 100

## 2018-09-21 MED ORDER — PENICILLIN G 3 MILLION UNITS IVPB - SIMPLE MED
3.0000 10*6.[IU] | INTRAVENOUS | Status: DC
  Administered 2018-09-21 – 2018-09-22 (×9): 3 10*6.[IU] via INTRAVENOUS
  Filled 2018-09-21 (×7): qty 100

## 2018-09-21 MED ORDER — OXYTOCIN 40 UNITS IN LACTATED RINGERS INFUSION - SIMPLE MED
1.0000 m[IU]/min | INTRAVENOUS | Status: DC
  Administered 2018-09-21 – 2018-09-22 (×2): 2 m[IU]/min via INTRAVENOUS
  Filled 2018-09-21 (×2): qty 1000

## 2018-09-21 MED ORDER — LACTATED RINGERS IV SOLN: 500.0000 mL | INTRAVENOUS | Status: DC | PRN

## 2018-09-21 MED ORDER — SOD CITRATE-CITRIC ACID 500-334 MG/5ML PO SOLN: 30.0000 mL | ORAL | Status: DC | PRN

## 2018-09-21 NOTE — Anesthesia Preprocedure Evaluation (Signed)
Anesthesia Evaluation  Patient identified by MRN, date of birth, ID band Patient awake    Reviewed: Allergy & Precautions, Patient's Chart, lab work & pertinent test results  History of Anesthesia Complications Negative for: history of anesthetic complications  Airway Mallampati: II  TM Distance: >3 FB Neck ROM: Full    Dental  (+) Teeth Intact, Dental Advisory Given   Pulmonary neg pulmonary ROS,    Pulmonary exam normal breath sounds clear to auscultation       Cardiovascular negative cardio ROS Normal cardiovascular exam Rhythm:Regular Rate:Normal     Neuro/Psych negative neurological ROS  negative psych ROS   GI/Hepatic negative GI ROS, Neg liver ROS,   Endo/Other  BMI 39.1  Renal/GU negative Renal ROS  negative genitourinary   Musculoskeletal negative musculoskeletal ROS (+)   Abdominal   Peds negative pediatric ROS (+)  Hematology negative hematology ROS (+)   Anesthesia Other Findings   Reproductive/Obstetrics (+) Pregnancy                             Anesthesia Physical Anesthesia Plan  ASA: II  Anesthesia Plan: Epidural   Post-op Pain Management:    Induction:   PONV Risk Score and Plan: 2 and Treatment may vary due to age or medical condition  Airway Management Planned: Natural Airway  Additional Equipment:   Intra-op Plan:   Post-operative Plan:   Informed Consent: I have reviewed the patients History and Physical, chart, labs and discussed the procedure including the risks, benefits and alternatives for the proposed anesthesia with the patient or authorized representative who has indicated his/her understanding and acceptance.     Plan Discussed with:   Anesthesia Plan Comments:         Anesthesia Quick Evaluation

## 2018-09-21 NOTE — Anesthesia Procedure Notes (Signed)
Epidural Patient location during procedure: OB  Staffing Anesthesiologist: Wood Novacek, MD Performed: anesthesiologist   Preanesthetic Checklist Completed: patient identified, site marked, surgical consent, pre-op evaluation, timeout performed, IV checked, risks and benefits discussed and monitors and equipment checked  Epidural Patient position: sitting Prep: DuraPrep Patient monitoring: heart rate, continuous pulse ox and blood pressure Approach: right paramedian Location: L4-L5 Injection technique: LOR saline  Needle:  Needle type: Tuohy  Needle gauge: 17 G Needle length: 9 cm and 9 Needle insertion depth: 9 cm Catheter type: closed end flexible Catheter size: 20 Guage Catheter at skin depth: 14 cm Test dose: negative  Assessment Events: blood not aspirated, injection not painful, no injection resistance, negative IV test and no paresthesia  Additional Notes Patient identified. Risks/Benefits/Options discussed with patient including but not limited to bleeding, infection, nerve damage, paralysis, failed block, incomplete pain control, headache, blood pressure changes, nausea, vomiting, reactions to medication both or allergic, itching and postpartum back pain. Confirmed with bedside nurse the patient's most recent platelet count. Confirmed with patient that they are not currently taking any anticoagulation, have any bleeding history or any family history of bleeding disorders. Patient expressed understanding and wished to proceed. All questions were answered. Sterile technique was used throughout the entire procedure. Please see nursing notes for vital signs. Test dose was given through epidural needle and negative prior to continuing to dose epidural or start infusion. Warning signs of high block given to the patient including shortness of breath, tingling/numbness in hands, complete motor block, or any concerning symptoms with instructions to call for help. Patient was given  instructions on fall risk and not to get out of bed. All questions and concerns addressed with instructions to call with any issues.     

## 2018-09-21 NOTE — Anesthesia Pain Management Evaluation Note (Signed)
  CRNA Pain Management Visit Note  Patient: Rebecca Woodward, 17 y.o., female  "[facility] We have an anesthesia team available at all times to provide care throughout the hospital, including epidural management and anesthesia for C-section. I don't know your plan for the delivery whether it a natural birth, water birth, IV sedation, nitrous supplementation, doula or epidural, but we want to meet your pain goals."   1.Was your pain managed to your expectations on prior hospitalizations?   No   2.What is your expectation for pain management during this hospitalization?     Epidural  3.How can we help you reach that goal? epidural  Record the patient's initial score and the patient's pain goal.   Pain: 6  Pain Goal: 8 [facility] wants you to be able to say your pain was always managed very well.  Rebecca Woodward, Rebecca Woodward 09/21/2018

## 2018-09-21 NOTE — Progress Notes (Signed)
Rebecca Woodward is a 17 y.o. G1P0 at 5248w6d by LMP admitted for induction of labor due to Post dates. Due date 09/15/18.  Subjective: Patient s/p epidural x 2. Reporting back pain 6/10, previously 10/10.  Coping well, supportive family at bedside.   Objective: BP 132/86   Pulse 67   Temp 98 F (36.7 C) (Oral)   Resp 16   Ht 5\' 4"  (1.626 m)   Wt 103.4 kg   LMP 11/24/2017   SpO2 99%   BMI 39.14 kg/m  I/O last 3 completed shifts: In: 928.2 [I.V.:578.2; IV Piggyback:350] Out: -  No intake/output data recorded.  FHT:  FHR: 125 bpm, variability: moderate,  accelerations:  Present,  decelerations:  Absent UC:   irregular, every 2-3 minutes SVE:   Dilation: 4.5 Effacement (%): 80 Station: -1, 0 Exam by:: [employer] Earl [employer]  Labs: Lab Results  Component Value Date   WBC 9.6 09/21/2018   HGB 9.4 (L) 09/21/2018   HCT 29.5 (L) 09/21/2018   MCV 79.9 (L) 09/21/2018   PLT 329 09/21/2018    Assessment / Plan: Induction of labor due to postterm,  progressing well on pitocin  Labor: Progressing on Pitocin, will continue to increase then AROM Preeclampsia:  N/A Fetal Wellbeing:  Category I Pain Control:  Epidural I/D:  n/a Anticipated MOD:  NSVD  Calvert CantorSamantha C Verlisa Vara, CNM 09/21/2018, 5:24 PM

## 2018-09-21 NOTE — H&P (Signed)
LABOR AND DELIVERY ADMISSION HISTORY AND PHYSICAL NOTE  Rebecca Woodward is a 17 y.o. female G1P0 with IUP at 6130w6d by LMP presenting for IOL for postdates.  She reports positive fetal movement. She denies leakage of fluid or vaginal bleeding.  Prenatal History/Complications: PNC at [facility] Pregnancy complications:  - Epilepsy, "occurs in sleep" no medication per patient  - Obesity in pregnancy  - Anemia in pregnancy  - ADHD  Past Medical History: Past Medical History:  Diagnosis Date  . Migraines     Past Surgical History: Past Surgical History:  Procedure Laterality Date  . NO PAST SURGERIES      Obstetrical History: OB History    Gravida  1   Para      Term      Preterm      AB      Living        SAB      TAB      Ectopic      Multiple      Live Births              Social History: Social History   Socioeconomic History  . Marital status: Single    Spouse name: Not on file  . Number of children: Not on file  . Years of education: Not on file  . Highest education level: Not on file  Occupational History  . Not on file  Social Needs  . Financial resource strain: Not on file  . Food insecurity:    Worry: Not on file    Inability: Not on file  . Transportation needs:    Medical: Not on file    Non-medical: Not on file  Tobacco Use  . Smoking status: Never Smoker  . Smokeless tobacco: Never Used  Substance and Sexual Activity  . Alcohol use: Never    Frequency: Never  . Drug use: Never  . Sexual activity: Yes  Lifestyle  . Physical activity:    Days per week: Not on file    Minutes per session: Not on file  . Stress: Not on file  Relationships  . Social connections:    Talks on phone: Not on file    Gets together: Not on file    Attends religious service: Not on file    Active member of club or organization: Not on file    Attends meetings of clubs or organizations: Not on file    Relationship status: Not on file  Other Topics  Concern  . Not on file  Social History Narrative  . Not on file    Family History: Family History  Problem Relation Age of Onset  . Hypertension Maternal Grandmother   . Diabetes Maternal Grandmother   . Diabetes Maternal Grandfather   . Hypertension Maternal Grandfather     Allergies: Allergies  Allergen Reactions  . Amoxicillin-Pot Clavulanate Diarrhea and Nausea And Vomiting    Medications Prior to Admission  Medication Sig Dispense Refill Last Dose  . Prenatal Vit-Fe Fumarate-FA (MULTIVITAMIN-PRENATAL) 27-0.8 MG TABS tablet Take 1 tablet by mouth daily at 12 noon.   Taking     Review of Systems  All systems reviewed and negative except as stated in HPI  Physical Exam Last menstrual period 11/24/2017. General appearance: alert, cooperative and no distress Lungs: clear to auscultation bilaterally Heart: regular rate and rhythm Abdomen: soft, non-tender; bowel sounds normal Extremities: No calf swelling or tenderness Presentation: cephalic by cervical examination Fetal monitoring: 130/moderate/+accels/ no  decelerations  Uterine activity: 1 mild uterine contraction     Prenatal labs: ABO, Rh: A/RH(D) POSITIVE/-- (05/14 0814) Antibody: NO ANTIBODIES DETECTED (05/14 0814) Rubella: 4.30 (05/14 0814) RPR: NON-REACTIVE (09/23 0846)  HBsAg: NON-REACTIVE (05/14 0814)  HIV: NON-REACTIVE (09/23 0846)  GC/Chlamydia: negative (10/25) GBS: Positive (10/25 0000)  2 hr Glucola: [id number], normal  Genetic screening:  negative Anatomy US: normal   Nursing Staff Provider  Office Location  [place] Dating  LMP,  Language  English Anatomy US  Wnl, boy  Flu Vaccine  06/30/18 Genetic Screen  NIPS: low risk female  AFP: wnl  TDaP vaccine   06/30/18 Hgb A1C or  GTT Early  Third trimester: negative  Rhogam   n/a   LAB RESULTS   Feeding Plan  Bottle Blood Type A/RH(D) POSITIVE/-- (05/14 1478)   Contraception  pills Antibody NO ANTIBODIES DETECTED (05/14 0814)  Circumcision  yes,  outside Rubella 4.30 (05/14 0814)  Pediatrician   [facility] RPR NON-REACTIVE (05/14 2956)   Support Person FOB Eric HBsAg NON-REACTIVE (05/14 2130)   Prenatal Classes  HIV NON-REACTIVE (05/14 0814)  BTL Consent  GBS  (For PCN allergy, check sensitivities) NEG  VBAC Consent  Pap  NA age    Hgb Electro      CF  NEG    SMA     Waterbirth  [ ]  Class [ ]  Consent [ ]  CNM visit    Prenatal Transfer Tool  Maternal Diabetes: No Genetic Screening: Normal Maternal Ultrasounds/Referrals: Normal Fetal Ultrasounds or other Referrals:  None Maternal Substance Abuse:  No Significant Maternal Medications:  None Significant Maternal Lab Results: Lab values include: Group B Strep positive  No results found for this or any previous visit (from the past 24 hour(s)).  Patient Active Problem List   Diagnosis Date Noted  . Encounter for induction of labor 09/21/2018  . UTI (urinary tract infection) 09/03/2018  . Yeast infection 09/03/2018  . GBS (group B Streptococcus carrier), +RV culture, currently pregnant 08/27/2018  . Anemia in pregnancy 07/20/2018  . Obesity in pregnancy 06/30/2018  . Supervision of normal first pregnancy, antepartum 03/09/2018  . Attention deficit hyperactivity disorder (ADHD) 07/24/2009  . Epilepsy (HCC) 07/24/2009    Rebecca Woodward is a 17 y.o. G1P0 at [redacted]w[redacted]d here for IOL for PD   #Labor: Cytotec IOL, patient declined FB insertion at this time- does not want FB, will consider FB if she needs more than 3 cytotec  #Pain: Plans epidural, medication ordered PRN  #FWB: Cat I  #ID:  GBS pos- PCN  #MOF: Formula  #MOC: OCPs, discussed LARC with patient- does not want IUD or Nexplanon  #Circ:  Yes, outpatient   Sharyon Cable, CNM 09/21/2018, 12:58 AM

## 2018-09-21 NOTE — Progress Notes (Signed)
LABOR PROGRESS NOTE  Rebecca Woodward is a 17 y.o. G1P0 at 5236w6d  admitted for IOL for postdates  Subjective: Agreeable to foley placement with pain medication   Objective: BP 131/74   Pulse 70   Temp 98.3 F (36.8 C) (Oral)   Resp 16   Ht 5\' 4"  (1.626 m)   Wt 103.4 kg   LMP 11/24/2017   SpO2 100%   BMI 39.14 kg/m  or  Vitals:   09/21/18 0546 09/21/18 0800 09/21/18 1017 09/21/18 1100  BP: 139/81 129/75 126/70 131/74  Pulse: 66 80 65 70  Resp: 16 15 15 16   Temp: 98.3 F (36.8 C)     TempSrc: Oral     SpO2: 100%     Weight:      Height:         Dilation: 2.5 Effacement (%): 80 Cervical Position: Middle Station: -1, 0 Presentation: Vertex Exam by:: Ferne CoeS Earl RNC FHT: baseline rate 130s, moderate varibility, + acel, no decel Toco: irregular contractions  Labs: Lab Results  Component Value Date   WBC 9.6 09/21/2018   HGB 9.4 (L) 09/21/2018   HCT 29.5 (L) 09/21/2018   MCV 79.9 (L) 09/21/2018   PLT 329 09/21/2018    Patient Active Problem List   Diagnosis Date Noted  . Encounter for induction of labor 09/21/2018  . UTI (urinary tract infection) 09/03/2018  . Yeast infection 09/03/2018  . GBS (group B Streptococcus carrier), +RV culture, currently pregnant 08/27/2018  . Anemia in pregnancy 07/20/2018  . Obesity in pregnancy 06/30/2018  . Supervision of normal first pregnancy, antepartum 03/09/2018  . Attention deficit hyperactivity disorder (ADHD) 07/24/2009  . Epilepsy (HCC) 07/24/2009    Assessment / Plan: 17 y.o. G1P0 at 3936w6d here for IOL for postdates  Labor: will place foley now; cytotec until foley out Fetal Wellbeing:  Cat 1 Pain Control:  Fentanyl now Anticipated MOD:  SVD  Gwenevere AbbotNimeka Delman Goshorn, MD OB Fellow  09/21/2018, 12:16 PM

## 2018-09-21 NOTE — Progress Notes (Signed)
OB/GYN Faculty Practice: Labor Progress Note  Subjective: Called into room by RN as patient having deceleration. Patient lying on right side with peanut ball. Cousin and father of baby, Minerva Areolaric, in room. States epidural is working well, comfortable.  Objective: BP 117/84   Pulse 93   Temp 98.6 F (37 C) (Oral)   Resp 18   Ht 5\' 4"  (1.626 m)   Wt 103.4 kg   LMP 11/24/2017   SpO2 99%   BMI 39.14 kg/m  Gen: tearful, quiet  Dilation: 5 Effacement (%): 70, 80 Cervical Position: Middle Station: -1, 0 Presentation: Vertex Exam by:: Ferne CoeS Earl RNC  Assessment and Plan: 17 y.o. G1P0 3983w6d here for PDIOL. Pregnancy complicated by epilepsy, obesity, anemia.  Labor: Midnight induction started with cytotec, FB placed this morning around 10am. Received 3 doses of cytotec. Not yet in labor, inadequate contractions.  -- pitocin started at 1500 - off at 1900 because of deceleration - will start once 10 minute strip without decelerations -- AROM clear fluid 1900 - IUPC in place -- pain control: epidural (just replaced for third time)  -- PPH Risk: medium   Fetal Well-Being: EFW 7-8lbs by Leopolds. Cephalic by sutures.  -- Category II - continuous fetal monitoring - FSE now in place, excellent variability but spontaneous decelerations, given bolus and repositioned -- GBS positive - adequate on PCN   Hildegard Hlavac S. Earlene PlaterWallace, DO OB/GYN Fellow, Faculty Practice  9:09 PM

## 2018-09-21 NOTE — Anesthesia Procedure Notes (Addendum)
Epidural Patient location during procedure: OB Start time: 09/21/2018 12:13 PM End time: 09/21/2018 12:16 PM  Staffing Anesthesiologist: Kaylyn LayerHowze, Kyair Ditommaso E, MD Performed: anesthesiologist   Preanesthetic Checklist Completed: patient identified, pre-op evaluation, timeout performed, IV checked, risks and benefits discussed and monitors and equipment checked  Epidural Patient position: sitting Prep: site prepped and draped and DuraPrep Patient monitoring: continuous pulse ox, blood pressure, heart rate and cardiac monitor Approach: midline Location: L3-L4 Injection technique: LOR air  Needle:  Needle type: Tuohy  Needle gauge: 17 G Needle length: 9 cm Needle insertion depth: 7 cm Catheter type: closed end flexible Catheter size: 19 Gauge Catheter at skin depth: 12 cm Test dose: negative and Other (1% lidocaine)  Assessment Events: blood not aspirated, injection not painful, no injection resistance, negative IV test and no paresthesia  Additional Notes Patient identified. Risks, benefits, and alternatives discussed with patient including but not limited to bleeding, infection, nerve damage, paralysis, failed block, incomplete pain control, headache, blood pressure changes, nausea, vomiting, reactions to medication, itching, and postpartum back pain. Confirmed with bedside nurse the patient's most recent platelet count. Confirmed with patient that they are not currently taking any anticoagulation, have any bleeding history, or any family history of bleeding disorders. Patient expressed understanding and wished to proceed. All questions were answered. Sterile technique was used throughout the entire procedure. Please see nursing notes for vital signs. Crisp LOR after one needle redirection. Scoliosis noted. Test dose was given through epidural catheter and negative prior to continuing to dose epidural or start infusion. Warning signs of high block given to the patient including shortness  of breath, tingling/numbness in hands, complete motor block, or any concerning symptoms with instructions to call for help. Patient was given instructions on fall risk and not to get out of bed. All questions and concerns addressed with instructions to call with any issues or inadequate analgesia.  Reason for block:procedure for pain

## 2018-09-21 NOTE — Anesthesia Procedure Notes (Addendum)
Epidural Patient location during procedure: OB Start time: 09/21/2018 2:30 PM End time: 09/21/2018 2:34 PM  Staffing Anesthesiologist: Kaylyn LayerHowze, Maudine Kluesner E, MD Performed: anesthesiologist   Preanesthetic Checklist Completed: patient identified, pre-op evaluation, timeout performed, IV checked, risks and benefits discussed and monitors and equipment checked  Epidural Patient position: sitting Prep: site prepped and draped and DuraPrep Patient monitoring: continuous pulse ox, blood pressure, heart rate and cardiac monitor Approach: midline Location: L3-L4 Injection technique: LOR air  Needle:  Needle type: Tuohy  Needle gauge: 17 G Needle length: 9 cm Needle insertion depth: 6 cm Catheter type: closed end flexible Catheter size: 19 Gauge Catheter at skin depth: 11 cm Test dose: negative and Other (1% lidocaine)  Assessment Events: blood not aspirated, injection not painful, no injection resistance, negative IV test and no paresthesia  Additional Notes Previous epidural with inadequate analgesia, removed with tip intact. Scoliosis noted on exam. Reapproached at same level with LOR at 6cm, catheter threaded easily. No signs or symptoms of intravascular or intrathecal injection with test dose. Reason for block:procedure for pain

## 2018-09-22 ENCOUNTER — Encounter (HOSPITAL_COMMUNITY): Payer: Self-pay

## 2018-09-22 DIAGNOSIS — O480 Post-term pregnancy: Secondary | ICD-10-CM

## 2018-09-22 DIAGNOSIS — Z3A40 40 weeks gestation of pregnancy: Secondary | ICD-10-CM

## 2018-09-22 DIAGNOSIS — O99824 Streptococcus B carrier state complicating childbirth: Secondary | ICD-10-CM

## 2018-09-22 MED ORDER — PRENATAL MULTIVITAMIN CH: 1.0000 | ORAL_TABLET | Freq: Every day | ORAL | Status: DC

## 2018-09-22 MED ORDER — ONDANSETRON HCL 4 MG/2ML IJ SOLN: 4.0000 mg | INTRAMUSCULAR | Status: DC | PRN

## 2018-09-22 MED ORDER — DIPHENHYDRAMINE HCL 25 MG PO CAPS: 25.0000 mg | ORAL_CAPSULE | Freq: Four times a day (QID) | ORAL | Status: DC | PRN

## 2018-09-22 MED ORDER — OXYCODONE HCL 5 MG PO TABS: 10.0000 mg | ORAL_TABLET | ORAL | Status: DC | PRN

## 2018-09-22 MED ORDER — DOCUSATE SODIUM 100 MG PO CAPS
100.0000 mg | ORAL_CAPSULE | Freq: Two times a day (BID) | ORAL | Status: DC
  Administered 2018-09-22 – 2018-09-24 (×4): 100 mg via ORAL
  Filled 2018-09-22 (×4): qty 1

## 2018-09-22 MED ORDER — IBUPROFEN 100 MG/5ML PO SUSP
600.0000 mg | Freq: Four times a day (QID) | ORAL | Status: DC
  Administered 2018-09-22 – 2018-09-24 (×8): 600 mg via ORAL
  Filled 2018-09-22 (×13): qty 30

## 2018-09-22 MED ORDER — SIMETHICONE 80 MG PO CHEW: 80.0000 mg | CHEWABLE_TABLET | ORAL | Status: DC | PRN

## 2018-09-22 MED ORDER — ONDANSETRON HCL 4 MG PO TABS: 4.0000 mg | ORAL_TABLET | ORAL | Status: DC | PRN

## 2018-09-22 MED ORDER — COCONUT OIL OIL: 1.0000 "application " | TOPICAL_OIL | Status: DC | PRN

## 2018-09-22 MED ORDER — ACETAMINOPHEN 325 MG PO TABS
650.0000 mg | ORAL_TABLET | ORAL | Status: DC | PRN
  Filled 2018-09-22: qty 2

## 2018-09-22 MED ORDER — BENZOCAINE-MENTHOL 20-0.5 % EX AERO
1.0000 "application " | INHALATION_SPRAY | CUTANEOUS | Status: DC | PRN
  Administered 2018-09-22: 1 via TOPICAL
  Filled 2018-09-22: qty 56

## 2018-09-22 MED ORDER — WITCH HAZEL-GLYCERIN EX PADS: 1.0000 "application " | MEDICATED_PAD | CUTANEOUS | Status: DC | PRN

## 2018-09-22 MED ORDER — OXYCODONE HCL 5 MG PO TABS: 5.0000 mg | ORAL_TABLET | ORAL | Status: DC | PRN

## 2018-09-22 MED ORDER — DIBUCAINE 1 % RE OINT: 1.0000 "application " | TOPICAL_OINTMENT | RECTAL | Status: DC | PRN

## 2018-09-22 MED ORDER — IBUPROFEN 600 MG PO TABS
600.0000 mg | ORAL_TABLET | Freq: Four times a day (QID) | ORAL | Status: DC
  Filled 2018-09-22: qty 1

## 2018-09-22 NOTE — Progress Notes (Signed)
OB/GYN Faculty Practice: Labor Progress Note  Subjective: Epidural uncomfortable, going to sit up more. Occasionally tearful, complaining about legs being numb.  Objective: BP 131/78   Pulse 64   Temp 98.7 F (37.1 C) (Oral)   Resp 18   Ht 5\' 4"  (1.626 m)   Wt 103.4 kg   LMP 11/24/2017   SpO2 99%   BMI 39.14 kg/m  Gen: tearful, quiet  Dilation: 5.5 Effacement (%): 80 Cervical Position: Middle Station: -1 Presentation: Vertex Exam by:: Irving BurtonEmily Rothermel RN   Assessment and Plan: 17 y.o. G1P0 3673w6d here for PDIOL. Pregnancy complicated by epilepsy, obesity, anemia.  Labor: Midnight induction 11/26 started with cytotec, FB now out. Received 3 doses of cytotec. Not yet in labor, inadequate contractions.  -- pitocin started at 1500 - off at 1900 because of deceleration - restarted and continuing to titrate as able -- AROM clear fluid 1900 - IUPC in place - nearly adequate  -- pain control: epidural (replaced three times) -- PPH Risk: medium   Fetal Well-Being: EFW 7-8lbs by Leopolds. Cephalic by sutures.  -- Category I - continuous fetal monitoring - FSE in place  -- GBS positive - adequate on PCN   Gaylord Seydel S. Earlene PlaterWallace, DO OB/GYN Fellow, Faculty Practice  2:52 AM

## 2018-09-22 NOTE — Progress Notes (Signed)
OB/GYN Faculty Practice: Labor Progress Note  Subjective: Has been able to get some rest, family in room.   Objective: BP 128/79   Pulse 72   Temp 98.6 F (37 C) (Oral)   Resp 18   Ht 5\' 4"  (1.626 m)   Wt 103.4 kg   LMP 11/24/2017   SpO2 100%   BMI 39.14 kg/m  Gen: tired appearing, NAD Dilation: 6 Effacement (%): 90 Cervical Position: Middle Station: -3 Presentation: Vertex Exam by:: Dr. Earlene PlaterWallace   Assessment and Plan: 17 y.o. G1P0 6967w6d here for PDIOL. Pregnancy complicated by epilepsy, obesity, anemia.  Labor: Midnight induction 11/26 started with cytotec, FB now out. Received 3 doses of cytotec. Not yet in labor, pitocin able to get to 14 with adequate MVUs for about an hour but the recurrent late decelerations.  -- pitocin started at 1500-1900, 2115-0445  -- pitocin break for next 3-4 hours, plan to restart around 0730  -- AROM clear fluid 1900 - IUPC in place  -- pain control: epidural (replaced three times) -- PPH Risk: medium   Fetal Well-Being: EFW 7-8lbs by Leopolds. Cephalic by sutures.  -- Category I - continuous fetal monitoring - FSE in place  -- GBS positive - adequate on PCN   Laurel S. Earlene PlaterWallace, DO OB/GYN Fellow, Faculty Practice  5:14 AM

## 2018-09-22 NOTE — Progress Notes (Signed)
Patient ID: Rennis ChrisMakayla J Woodward, female   DOB: 11/13/2000, 17 y.o.   MRN: [id number] Doing well Variable decels with UCs, now improved  Vitals:   09/22/18 0831 09/22/18 0901 09/22/18 0915 09/22/18 0920  BP: 118/65 133/84    Pulse: 73 74    Resp:  17    Temp:      TempSrc:      SpO2:   99% 99%  Weight:      Height:       FHR with good variability UCs every 3 min  Dilation: 7 Effacement (%): 80, 90 Cervical Position: Anterior Station: -2 Presentation: Vertex Exam by:: Dr. Earlene PlaterWallace   Will continue to observe. Consider AROM

## 2018-09-23 MED ORDER — COMPLETENATE 29-1 MG PO CHEW
1.0000 | CHEWABLE_TABLET | Freq: Every day | ORAL | Status: DC
  Administered 2018-09-23 – 2018-09-24 (×2): 1 via ORAL
  Filled 2018-09-23 (×3): qty 1

## 2018-09-23 MED ORDER — ACETAMINOPHEN 160 MG/5ML PO SOLN
650.0000 mg | Freq: Four times a day (QID) | ORAL | Status: DC | PRN
  Administered 2018-09-23: 650 mg via ORAL
  Filled 2018-09-23: qty 20.3

## 2018-09-23 NOTE — Progress Notes (Signed)
Post Partum Day 1 Subjective: no complaints, up ad lib, voiding, tolerating PO and + flatus  Objective: Blood pressure 116/72, pulse 73, temperature 99 F (37.2 C), temperature source Oral, resp. rate 18, height 5\' 4"  (1.626 m), weight 103.4 kg, last menstrual period 11/24/2017, SpO2 100 %, unknown if currently breastfeeding.  Physical Exam:  General: alert, cooperative and no distress Lochia: appropriate Uterine Fundus: firm Incision: n/a DVT Evaluation: No evidence of DVT seen on physical exam.  Recent Labs    09/21/18 0040  HGB 9.4*  HCT 29.5*    Assessment/Plan: Plan for discharge tomorrow, Discharge home and Contraception Discussed LARCs as most effective. Pt thinking about OCPs. She is not breastfeeding.   LOS: 2 days   Sharen CounterLisa Leftwich-Kirby 09/23/2018, 11:33 AM

## 2018-09-23 NOTE — Anesthesia Postprocedure Evaluation (Signed)
Anesthesia Post Note  Patient: Rennis ChrisMakayla J Cilia  Procedure(s) Performed: AN AD HOC LABOR EPIDURAL     Patient location during evaluation: Mother Baby Anesthesia Type: Epidural Level of consciousness: awake and alert and oriented Pain management: satisfactory to patient Vital Signs Assessment: post-procedure vital signs reviewed and stable Respiratory status: respiratory function stable Cardiovascular status: stable Postop Assessment: no headache, no backache, epidural receding, patient able to bend at knees, no signs of nausea or vomiting and adequate PO intake Anesthetic complications: no    Last Vitals:  Vitals:   09/23/18 0200 09/23/18 0620  BP: (!) 101/56 116/72  Pulse: 78 73  Resp: 16 18  Temp:    SpO2: 100% 100%    Last Pain:  Vitals:   09/23/18 0620  TempSrc:   PainSc: 0-No pain   Pain Goal: Patients Stated Pain Goal: 8 (09/22/18 0903)               Karleen DolphinFUSSELL,Paiton Boultinghouse

## 2018-09-24 MED ORDER — IBUPROFEN 100 MG/5ML PO SUSP: 600.0000 mg | Freq: Four times a day (QID) | ORAL | 2 refills | Status: DC

## 2018-09-24 NOTE — Discharge Instructions (Signed)
Vaginal Delivery, Care After °Refer to this sheet in the next few weeks. These instructions provide you with information about caring for yourself after vaginal delivery. Your health care provider may also give you more specific instructions. Your treatment has been planned according to current medical practices, but problems sometimes occur. Call your health care provider if you have any problems or questions. °What can I expect after the procedure? °After vaginal delivery, it is common to have: °· Some bleeding from your vagina. °· Soreness in your abdomen, your vagina, and the area of skin between your vaginal opening and your anus (perineum). °· Pelvic cramps. °· Fatigue. ° °Follow these instructions at home: °Medicines °· Take over-the-counter and prescription medicines only as told by your health care provider. °· If you were prescribed an antibiotic medicine, take it as told by your health care provider. Do not stop taking the antibiotic until it is finished. °Driving ° °· Do not drive or operate heavy machinery while taking prescription pain medicine. °· Do not drive for 24 hours if you received a sedative. °Lifestyle °· Do not drink alcohol. This is especially important if you are breastfeeding or taking medicine to relieve pain. °· Do not use tobacco products, including cigarettes, chewing tobacco, or e-cigarettes. If you need help quitting, ask your health care provider. °Eating and drinking °· Drink at least 8 eight-ounce glasses of water every day unless you are told not to by your health care provider. If you choose to breastfeed your baby, you may need to drink more water than this. °· Eat high-fiber foods every day. These foods may help prevent or relieve constipation. High-fiber foods include: °? Whole grain cereals and breads. °? Brown rice. °? Beans. °? Fresh fruits and vegetables. °Activity °· Return to your normal activities as told by your health care provider. Ask your health care provider  what activities are safe for you. °· Rest as much as possible. Try to rest or take a nap when your baby is sleeping. °· Do not lift anything that is heavier than your baby or 10 lb (4.5 kg) until your health care provider says that it is safe. °· Talk with your health care provider about when you can engage in sexual activity. This may depend on your: °? Risk of infection. °? Rate of healing. °? Comfort and desire to engage in sexual activity. °Vaginal Care °· If you have an episiotomy or a vaginal tear, check the area every day for signs of infection. Check for: °? More redness, swelling, or pain. °? More fluid or blood. °? Warmth. °? Pus or a bad smell. °· Do not use tampons or douches until your health care provider says this is safe. °· Watch for any blood clots that may pass from your vagina. These may look like clumps of dark red, brown, or black discharge. °General instructions °· Keep your perineum clean and dry as told by your health care provider. °· Wear loose, comfortable clothing. °· Wipe from front to back when you use the toilet. °· Ask your health care provider if you can shower or take a bath. If you had an episiotomy or a perineal tear during labor and delivery, your health care provider may tell you not to take baths for a certain length of time. °· Wear a bra that supports your breasts and fits you well. °· If possible, have someone help you with household activities and help care for your baby for at least a few days after   you leave the hospital. °· Keep all follow-up visits for you and your baby as told by your health care provider. This is important. °Contact a health care provider if: °· You have: °? Vaginal discharge that has a bad smell. °? Difficulty urinating. °? Pain when urinating. °? A sudden increase or decrease in the frequency of your bowel movements. °? More redness, swelling, or pain around your episiotomy or vaginal tear. °? More fluid or blood coming from your episiotomy or  vaginal tear. °? Pus or a bad smell coming from your episiotomy or vaginal tear. °? A fever. °? A rash. °? Little or no interest in activities you used to enjoy. °? Questions about caring for yourself or your baby. °· Your episiotomy or vaginal tear feels warm to the touch. °· Your episiotomy or vaginal tear is separating or does not appear to be healing. °· Your breasts are painful, hard, or turn red. °· You feel unusually sad or worried. °· You feel nauseous or you vomit. °· You pass large blood clots from your vagina. If you pass a blood clot from your vagina, save it to show to your health care provider. Do not flush blood clots down the toilet without having your health care provider look at them. °· You urinate more than usual. °· You are dizzy or light-headed. °· You have not breastfed at all and you have not had a menstrual period for 12 weeks after delivery. °· You have stopped breastfeeding and you have not had a menstrual period for 12 weeks after you stopped breastfeeding. °Get help right away if: °· You have: °? Pain that does not go away or does not get better with medicine. °? Chest pain. °? Difficulty breathing. °? Blurred vision or spots in your vision. °? Thoughts about hurting yourself or your baby. °· You develop pain in your abdomen or in one of your legs. °· You develop a severe headache. °· You faint. °· You bleed from your vagina so much that you fill two sanitary pads in one hour. °This information is not intended to replace advice given to you by your health care provider. Make sure you discuss any questions you have with your health care provider. °Document Released: 10/10/2000 Document Revised: 03/26/2016 Document Reviewed: 10/28/2015 °[employer] Interactive Patient Education © 2018 [employer] ° °

## 2018-09-24 NOTE — Discharge Summary (Signed)
OB Discharge Summary     Patient Name: Rebecca Woodward DOB: 03/07/2001 MRN: [id number]  Date of admission: 09/21/2018 Delivering MD: Lyndel SafeNEWTON, KIMBERLY NILES   Date of discharge: 09/24/2018  Admitting diagnosis: INDUCTION Intrauterine pregnancy: 9145w0d     Secondary diagnosis:  Principal Problem:   Normal spontaneous vaginal delivery Active Problems:   Supervision of normal first pregnancy, antepartum   Attention deficit hyperactivity disorder (ADHD)   Epilepsy (HCC)   Obesity in pregnancy   Anemia in pregnancy   GBS (group B Streptococcus carrier), +RV culture, currently pregnant   UTI (urinary tract infection)   Obstetrical laceration, second degree  Additional problems: None     Discharge diagnosis: Term Pregnancy Delivered                                                                                                Post partum procedures:None  Augmentation: AROM, Pitocin, Cytotec and Foley Balloon  Complications: None  Hospital course:  Induction of Labor With Vaginal Delivery   17 y.o. yo G1P1001 at 6245w0d was admitted to the hospital 09/21/2018 for induction of labor.  Indication for induction: Postdates.  Patient had an uncomplicated labor course as follows: Membrane Rupture Time/Date: 7:08 PM ,09/21/2018   Intrapartum Procedures: Episiotomy: None [1]                                         Lacerations:  2nd degree [3];Perineal [11]  Patient had delivery of a Viable infant.  Information for the patient's newborn:  Rebecca Woodward, Rebecca Woodward [[id number]][[id number]]  Delivery Method: Vaginal, Spontaneous(Filed from Delivery Summary)   09/22/2018  Details of delivery can be found in separate delivery note.  Patient had a routine postpartum course. Patient is discharged home 09/24/18.  Physical exam  Vitals:   09/23/18 0200 09/23/18 0620 09/23/18 2116 09/24/18 0643  BP: (!) 101/56 116/72 118/68 106/73  Pulse: 78 73 68 71  Resp: 16 18    Temp:   98.4 F (36.9 C) (!) 97.5 F (36.4  C)  TempSrc:   Oral Oral  SpO2: 100% 100% 100%   Weight:      Height:       General: alert, cooperative and no distress Lochia: appropriate Uterine Fundus: firm Incision: N/A DVT Evaluation: No evidence of DVT seen on physical exam. Negative Homan's sign. No cords or calf tenderness. Labs: Lab Results  Component Value Date   WBC 9.6 09/21/2018   HGB 9.4 (L) 09/21/2018   HCT 29.5 (L) 09/21/2018   MCV 79.9 (L) 09/21/2018   PLT 329 09/21/2018   No flowsheet data found.  Discharge instruction: per After Visit Summary and "Baby and Me Booklet".  After visit meds:  Allergies as of 09/24/2018      Reactions   Amoxicillin-pot Clavulanate Diarrhea, Nausea And Vomiting      Medication List    TAKE these medications   ibuprofen 100 MG/5ML suspension Commonly known as:  ADVIL,MOTRIN Take 30 mLs (600 mg total) by mouth every  6 (six) hours.   multivitamin-prenatal 27-0.8 MG Tabs tablet Take 1 tablet by mouth daily at 12 noon.       Diet: routine diet  Activity: Advance as tolerated. Pelvic rest for 6 weeks.   Follow up Appt: Future Appointments  Date Time Provider Department Center  10/18/2018  8:30 AM Allie Bossier, MD [facility]   Follow up Visit:No follow-ups on file.  Postpartum contraception: Combination OCPs  Newborn Data: Live born female  Birth Weight: 6 lb 3.3 oz (2815 g) APGAR: 8, 9  Newborn Delivery   Birth date/time:  09/22/2018 13:22:00 Delivery type:  Vaginal, Spontaneous     Baby Feeding: Bottle Disposition:rooming in for jaundice treatment   09/24/2018 Jaynie Collins, MD

## 2018-10-18 ENCOUNTER — Ambulatory Visit: Payer: Medicaid Other | Admitting: Obstetrics & Gynecology

## 2018-10-25 ENCOUNTER — Ambulatory Visit: Payer: Medicaid Other | Admitting: Obstetrics & Gynecology

## 2018-10-29 ENCOUNTER — Ambulatory Visit: Payer: Medicaid Other

## 2018-11-02 ENCOUNTER — Telehealth: Payer: Self-pay | Admitting: *Deleted

## 2018-11-02 NOTE — Telephone Encounter (Signed)
I have called patient several times since 10/29/2018 to try to reschedule her Postpartum appointment. Patient does not have a voicemail that is set up to leave a message. Tried to call her mother but the number belongs to someone else now.

## 2020-06-20 ENCOUNTER — Encounter: Payer: Self-pay | Admitting: *Deleted

## 2020-06-20 DIAGNOSIS — Z3480 Encounter for supervision of other normal pregnancy, unspecified trimester: Secondary | ICD-10-CM | POA: Insufficient documentation

## 2020-06-22 ENCOUNTER — Other Ambulatory Visit (HOSPITAL_COMMUNITY)
Admission: RE | Admit: 2020-06-22 | Discharge: 2020-06-22 | Disposition: A | Discharge status: Home / Self Care | Payer: Medicaid Other | Source: Ambulatory Visit | Attending: Certified Nurse Midwife | Admitting: Certified Nurse Midwife

## 2020-06-22 ENCOUNTER — Ambulatory Visit (INDEPENDENT_AMBULATORY_CARE_PROVIDER_SITE_OTHER): Payer: Medicaid Other | Admitting: Certified Nurse Midwife

## 2020-06-22 ENCOUNTER — Encounter: Payer: Self-pay | Admitting: Certified Nurse Midwife

## 2020-06-22 ENCOUNTER — Other Ambulatory Visit: Payer: Self-pay

## 2020-06-22 VITALS — BP 125/79 | HR 95 | Wt 223.0 lb

## 2020-06-22 DIAGNOSIS — Z3A10 10 weeks gestation of pregnancy: Secondary | ICD-10-CM | POA: Diagnosis not present

## 2020-06-22 DIAGNOSIS — Z3480 Encounter for supervision of other normal pregnancy, unspecified trimester: Secondary | ICD-10-CM | POA: Insufficient documentation

## 2020-06-22 DIAGNOSIS — O99210 Obesity complicating pregnancy, unspecified trimester: Secondary | ICD-10-CM

## 2020-06-22 DIAGNOSIS — O99211 Obesity complicating pregnancy, first trimester: Secondary | ICD-10-CM | POA: Diagnosis not present

## 2020-06-22 DIAGNOSIS — Z7189 Other specified counseling: Secondary | ICD-10-CM

## 2020-06-22 DIAGNOSIS — Z3481 Encounter for supervision of other normal pregnancy, first trimester: Secondary | ICD-10-CM | POA: Insufficient documentation

## 2020-06-22 NOTE — Progress Notes (Signed)
Subjective:   Rebecca Woodward is a 19 y.o. G2P1001 at [redacted]w[redacted]d by LMP being seen today for her first obstetrical visit.  Her obstetrical history is significant for obesity. Patient does not intend to breast feed. Pregnancy history fully reviewed.  Patient reports nausea.  HISTORY: OB History  Gravida Para Term Preterm AB Living  2 1 1  0 0 1  SAB TAB Ectopic Multiple Live Births  0 0 0 0 1    # Outcome Date GA Lbr Len/2nd Weight Sex Delivery Anes PTL Lv  2 Current           1 Term 09/22/18 [redacted]w[redacted]d 06:53 / 01:57 6 lb 3.3 oz (2.815 kg) M Vag-Spont EPI  LIV     Name: Stradling,BOY Gladiola     Apgar1: 8  Apgar5: 9   Past Medical History:  Diagnosis Date  . Migraines    Past Surgical History:  Procedure Laterality Date  . NO PAST SURGERIES     Family History  Problem Relation Age of Onset  . Hypertension Maternal Grandmother   . Diabetes Maternal Grandmother   . Diabetes Maternal Grandfather   . Hypertension Maternal Grandfather    Social History   Tobacco Use  . Smoking status: Never Smoker  . Smokeless tobacco: Never Used  Vaping Use  . Vaping Use: Never used  Substance Use Topics  . Alcohol use: Never  . Drug use: Never   Allergies  Allergen Reactions  . Amoxicillin-Pot Clavulanate Diarrhea and Nausea And Vomiting   Current Outpatient Medications on File Prior to Visit  Medication Sig Dispense Refill  . acetaminophen (TYLENOL) 80 MG chewable tablet Chew 80 mg by mouth every 6 (six) hours as needed.    . Prenatal Vit-Min-FA-Fish Oil ([employer] PRENATAL GUMMY) 0.4-113.5 MG CHEW Chew by mouth.     No current facility-administered medications on file prior to visit.     Indications for ASA therapy (per uptodate) One of the following: Previous pregnancy with preeclampsia, especially early onset and with an adverse outcome No Multifetal gestation No Chronic hypertension No Type 1 or 2 diabetes mellitus No Chronic kidney disease No Autoimmune disease (antiphospholipid  syndrome, systemic lupus erythematosus) No   Two or more of the following: Nulliparity No Obesity (body mass index >30 kg/m2) Yes Family history of preeclampsia in mother or sister No Age ?35 years No Sociodemographic characteristics (African American race, low socioeconomic level) No Personal risk factors (eg, previous pregnancy with low birth weight or small for gestational age infant, previous adverse pregnancy outcome [eg, stillbirth], interval >10 years between pregnancies) No   Indications for early 1 hour GTT (per uptodate)  BMI >25 (>23 in Asian women) AND one of the following  Gestational diabetes mellitus in a previous pregnancy No Glycated hemoglobin ?5.7 percent (39 mmol/mol), impaired glucose tolerance, or impaired fasting glucose on previous testing No First-degree relative with diabetes No High-risk race/ethnicity (eg, African American, Latino, Native American, 06-24-1992 American, Pacific Islander) No History of cardiovascular disease No Hypertension or on therapy for hypertension No High-density lipoprotein cholesterol level <35 mg/dL ([place] mmol/L) and/or a triglyceride level >250 mg/dL (2.70 mmol/L) No Polycystic ovary syndrome No Physical inactivity Yes Other clinical condition associated with insulin resistance (eg, severe obesity, acanthosis nigricans) No Previous birth of an infant weighing ?4000 g No Previous stillbirth of unknown cause No   Exam   Vitals:   06/22/20 0818  BP: 125/79  Pulse: 95  Weight: 223 lb (101.2 kg)   Fetal  Heart Rate (bpm): 174  Uterus:     Pelvic Exam: Perineum: no hemorrhoids, normal perineum   Vulva: normal external genitalia, no lesions   Vagina:  normal mucosa, normal discharge   Cervix: no lesions and normal, pap smear done    Adnexa: normal adnexa and no mass, fullness, tenderness   Bony Pelvis: average  System: General: well-developed, well-nourished female in no acute distress   Breast:  normal appearance, no masses or  tenderness   Skin: normal coloration and turgor, no rashes   Neurologic: oriented, normal, negative, normal mood   Extremities: normal strength, tone, and muscle mass, ROM of all joints is normal   HEENT PERRLA, extraocular movement intact and sclera clear, anicteric   Mouth/Teeth mucous membranes moist, pharynx normal without lesions and dental hygiene good   Neck supple and no masses   Cardiovascular: regular rate and rhythm   Respiratory:  no respiratory distress, normal breath sounds   Abdomen: soft, non-tender; bowel sounds normal; no masses,  no organomegaly     Assessment:   Pregnancy: G2P1001 Patient Active Problem List   Diagnosis Date Noted  . Supervision of other normal pregnancy, antepartum 06/22/2020  . Obesity in pregnancy 06/22/2020  . Attention deficit hyperactivity disorder (ADHD) 07/24/2009  . Epilepsy (HCC) 07/24/2009     Plan:  1. Supervision of other normal pregnancy, antepartum - Obstetric panel - HIV antibody (with reflex) - Hepatitis C Antibody - Culture, OB Urine - Cytology - PAP( [place]) - Cervicovaginal ancillary only( Post) - HgB A1c - Hemoglobinopathy Evaluation - Babyscripts Schedule Optimization  The patient was counseled on the potential benefits and lack of known risks of COVID vaccination, during pregnancy and breastfeeding, on today's visit. The patient's questions and concerns were addressed today. The patient is still unsure of her decision for vaccination but is considering getting it.   2. [redacted] weeks gestation of pregnancy  Initial labs drawn. A1C ordered Started on ASA n/a Flu vax n/a Continue prenatal vitamins. Genetic Screening discussed, NIPS: requested. Ultrasound discussed; fetal anatomic survey: requested. Problem list reviewed and updated The nature of [facility] with multiple MDs and other Advanced Practice Providers was explained to patient; also emphasized that fellows,  residents, and students are part of our team. Routine obstetric precautions reviewed Return in about 2 weeks (around 07/06/2020).   Donette Larry, CNM 9:04 AM 06/22/20

## 2020-06-22 NOTE — Patient Instructions (Addendum)
Safe Medications in Pregnancy    Acne: Benzoyl Peroxide Salicylic Acid  Backache/Headache: Tylenol: 2 regular strength every 4 hours OR              2 Extra strength every 6 hours  Colds/Coughs/Allergies: Benadryl (alcohol free) 25 mg every 6 hours as needed Breath right strips Claritin Cepacol throat lozenges Chloraseptic throat spray Cold-Eeze- up to three times per day Cough drops, alcohol free Flonase (by prescription only) Guaifenesin Mucinex Robitussin DM (plain only, alcohol free) Saline nasal spray/drops Sudafed (pseudoephedrine) & Actifed ** use only after [redacted] weeks gestation and if you do not have high blood pressure Tylenol Vicks Vaporub Zinc lozenges Zyrtec   Constipation: Colace Ducolax suppositories Fleet enema Glycerin suppositories Metamucil Milk of magnesia Miralax Senokot Smooth move tea  Diarrhea: Kaopectate Imodium A-D  *NO pepto Bismol  Hemorrhoids: Anusol Anusol HC Preparation H Tucks  Indigestion: Tums Maalox Mylanta Zantac  Pepcid  Insomnia: Benadryl (alcohol free) 25mg  every 6 hours as needed Tylenol PM Unisom, no Gelcaps  Leg Cramps: Tums MagGel  Nausea/Vomiting:  Bonine Dramamine Emetrol Ginger extract Sea bands Meclizine  Nausea medication to take during pregnancy:  Unisom (doxylamine succinate 25 mg tablets) Take one tablet daily at bedtime. If symptoms are not adequately controlled, the dose can be increased to a maximum recommended dose of two tablets daily (1/2 tablet in the morning, 1/2 tablet mid-afternoon and one at bedtime). Vitamin B6 100mg  tablets. Take one tablet twice a day (up to 200 mg per day).  Skin Rashes: Aveeno products Benadryl cream or 25mg  every 6 hours as needed Calamine Lotion 1% cortisone cream  Yeast infection: Gyne-lotrimin 7 Monistat 7   **If taking multiple medications, please check labels to avoid duplicating the same active ingredients **take  medication as directed on the label ** Do not exceed 4000 mg of tylenol in 24 hours **Do not take medications that contain aspirin or ibuprofen     Morning Sickness  Morning sickness is when you feel sick to your stomach (nauseous) during pregnancy. You may feel sick to your stomach and throw up (vomit). You may feel sick in the morning, but you can feel this way at any time of day. Some women feel very sick to their stomach and cannot stop throwing up (hyperemesis gravidarum). Follow these instructions at home: Medicines  Take over-the-counter and prescription medicines only as told by your doctor. Do not take any medicines until you talk with your doctor about them first.  Taking multivitamins before getting pregnant can stop or lessen the harshness of morning sickness. Eating and drinking  Eat dry toast or crackers before getting out of bed.  Eat 5 or 6 small meals a day.  Eat dry and bland foods like rice and baked potatoes.  Do not eat greasy, fatty, or spicy foods.  Have someone cook for you if the smell of food causes you to feel sick or throw up.  If you feel sick to your stomach after taking prenatal vitamins, take them at night or with a snack.  Eat protein when you need a snack. Nuts, yogurt, and cheese are good choices.  Drink fluids throughout the day.  Try ginger ale made with real ginger, ginger tea made from fresh grated ginger, or ginger candies. General instructions  Do not use any products that have nicotine or tobacco in them, such as cigarettes and e-cigarettes. If you need help quitting, ask your doctor.  Use an air purifier to keep the air in your  house free of smells.  Get lots of fresh air.  Try to avoid smells that make you feel sick.  Try: ? Wearing a bracelet that is used for seasickness (acupressure wristband). ? Going to a doctor who puts thin needles into certain body points (acupuncture) to improve how you feel. Contact a doctor  if:  You need medicine to feel better.  You feel dizzy or light-headed.  You are losing weight. Get help right away if:  You feel very sick to your stomach and cannot stop throwing up.  You pass out (faint).  You have very bad pain in your belly. Summary  Morning sickness is when you feel sick to your stomach (nauseous) during pregnancy.  You may feel sick in the morning, but you can feel this way at any time of day.  Making some changes to what you eat may help your symptoms go away. This information is not intended to replace advice given to you by your health care provider. Make sure you discuss any questions you have with your health care provider. Document Revised: 09/25/2017 Document Reviewed: 11/13/2016 Elsevier Patient Education  2020 Elsevier Inc.  COVID-19 Vaccine Information can be found at: [email or web address] For questions related to vaccine distribution or appointments, please email [email or web address] or call [phone].     Coronavirus (COVID-19) and Pregnancy:  Frequently Asked Questions   How might coronavirus affect my pregnancy? The data for COVID-19 is limited, but we know that women with other coronavirus infections (such as SARS-CoV) did NOT have miscarriage or stillbirth at higher rates than the general population.  On the other hand, we know that having other respiratory viral infections during pregnancy, such as flu, has been associated with problems like low birth weight and preterm birth. Also, having a high fever early in pregnancy may increase the risk of certain birth defects.  Could I transmit coronavirus to my baby during pregnancy or delivery? Among the few case studies of infants born to mothers with COVID-19 published in peer-reviewed literature, none of the infants tested positive for the virus. And there have been no reports of mother-to-baby transmission for other coronaviruses  (MERS-CoV and SARS-CoV). Also, there was no virus detected in samples of amniotic fluid or breast milk. But there have been a few reports of newborns as young as a few days old with infection, suggesting that a mother can transmit the infection to her infant through close contact after delivery.  Is it safe for me to deliver at a hospital where there have been COVID-19 cases? It should be. We know that COVID-19 is a very scary virus. The good news is that hospitals are taking great precautions to keep patients and healthcare providers safe.  According to the CDC guidelines, when a patient is even suspected to have COVID-19, they should be placed in a negative pressure room. (Think of these rooms as vacuums that suck and filter the air so it's safe for the other people in the hospital.) If there are no rooms available, these patients should be asked to wait at home until they can be accomodated safely. This should make it possible for you to deliver at the hospital without putting you or your baby at risk.  Hospitals are also implementing stricter visiting policies to keep patients safe. It's worth calling your hospital to check if there are any new regulations to be aware of.  What plans should I make now in case the hospital system is overwhelmed when it's time for me to  deliver? Every hospital is making different plans for dealing with this scenario. Talk with your doctor or midwife once you're at least [redacted] weeks pregnant. I work in [employer].   I work in [employer]. Should I ask my doctor to excuse me from work until the baby is born? Should I ask my doctor to excuse me from work until the baby is born? What if I work in a school, the travel industry, or some other high-risk setting? Healthcare facilities should take care to limit the exposure of pregnant employees to patients with confirmed or suspected COVID-19, just as they would with other infectious cases. If you continue working, be sure to  follow the CDC's risk assessment and infection control guidelines.  If you work in a school, travel industry, or other high-risk setting, talk with your employer about what it's doing to protect employees and minimize infection risks. Wash your hands often.   What if my OB gets COVID-19? If your doctor or midwife tests positive for COVID-19, they will need to be quarantined until they recover and are no longer at risk of transmitting the virus. In this case, you'll be assigned to another OB in your doctor's practice (or you may choose another practitioner yourself). Ask your new OB or your doctor's office if you should self-quarantine or be tested for the virus. (It will depend on when you last saw your provider and when that person tested positive.)  Should we hold off on trying to conceive because of COVID-19? At this time, there's no reason to hold off on trying to get pregnant, but the data we have is really limited. For example, we don't think the virus causes birth defects or increases your risk of miscarriage. But we don't know for sure whether you could transmit COVID-19 to your baby before or during delivery. We also don't know if the virus lives in semen or can be sexually transmitted.  We have a babymoon scheduled in the next few months - should we cancel? Yes. At this time, the virus has reached more than 140 countries, and there are travel bans to [place], most of [place], and [place]. Places where large numbers of people gather are at highest risk, especially airports and cruise ships.  If you were planning travel in the [place] note that any travel setting increases your risk of exposure, and there are already many places where everyone is being asked to stay home. To see how the virus is spreading, check The [place] Times map based on CDC data.  For the most current advice to help you avoid exposure, check the CDC's COVID-19 travel page.  Will the hospital separate me from my newborn and  keep the baby in quarantine? If you don't have COVID-19 and have not been exposed to the virus, the hospital will not separate you from your newborn. If you do test positive for COVID-19 or have been exposed but have no symptoms, the CDC, [employer]ians and Gynecologists, and the Society for Maternal-Fetal Medicine all recommend that you be separated from your baby to decrease the risk of transmission to the baby. This would last until you are no longer at risk of transmitting the virus.  This scenario would, of course, be beyond heartbreaking. Talk to the hospital, your baby's pediatrician, and your family about how to plan for care of your baby in the event that you have to be separated after delivery. And try to make sure you have the emotional support you  would need to endure the sadness and stress of having to potentially wait weeks to meet your newborn.   My hospital is restricting visitors and only allowing one support person. If my support person leaves after the delivery, will they be allowed to come back? Every hospital has different policies. Contact your hospital or labor and delivery unit a week or so before delivery to get the most up-to-date restrictions. In general, if your support person needs to leave, they would be allowed back unless they knew they were exposed to COVID-19 after leaving your company.  My mom was planning to fly here to help me care for my new baby after delivery. Should I tell her not to come? Yes. If your mom is over 60 or has any serious chronic medical conditions (such as heart disease, lung disease, or diabetes), she is at higher risk of serious illness from COVID-19 and should avoid air travel. And remember that any travel setting increases a person's risk of exposure. So, it may be risky to have her around the baby after she has been traveling. For the most current advice on traveling, check the CDC's COVID-19 travel page.

## 2020-06-22 NOTE — Progress Notes (Signed)
Bedside U/S shows single IUP with FHT of 174 BPM and GA is [redacted]w[redacted]d

## 2020-06-25 ENCOUNTER — Encounter: Payer: Self-pay | Admitting: Certified Nurse Midwife

## 2020-06-25 DIAGNOSIS — O99019 Anemia complicating pregnancy, unspecified trimester: Secondary | ICD-10-CM | POA: Insufficient documentation

## 2020-06-25 LAB — OBSTETRIC PANEL
Absolute Monocytes: 439 cells/uL (ref 200–950)
Antibody Screen: NOT DETECTED
Basophils Absolute: 22 cells/uL (ref 0–200)
Basophils Relative: 0.3 %
Eosinophils Absolute: 108 cells/uL (ref 15–500)
Eosinophils Relative: 1.5 %
HCT: 34.6 % — ABNORMAL LOW (ref 35.0–45.0)
Hemoglobin: 10.9 g/dL — ABNORMAL LOW (ref 11.7–15.5)
Hepatitis B Surface Ag: NONREACTIVE
Lymphs Abs: 1368 cells/uL (ref 850–3900)
MCH: 25.1 pg — ABNORMAL LOW (ref 27.0–33.0)
MCHC: 31.5 g/dL — ABNORMAL LOW (ref 32.0–36.0)
MCV: 79.7 fL — ABNORMAL LOW (ref 80.0–100.0)
MPV: 11.5 fL (ref 7.5–12.5)
Monocytes Relative: 6.1 %
Neutro Abs: 5263 cells/uL (ref 1500–7800)
Neutrophils Relative %: 73.1 %
Platelets: 316 10*3/uL (ref 140–400)
RBC: 4.34 10*6/uL (ref 3.80–5.10)
RDW: 16 % — ABNORMAL HIGH (ref 11.0–15.0)
RPR Ser Ql: NONREACTIVE
Rubella: 4.85 Index
Total Lymphocyte: 19 %
WBC: 7.2 10*3/uL (ref 3.8–10.8)

## 2020-06-25 LAB — HEMOGLOBINOPATHY EVALUATION
Fetal Hemoglobin Testing: <1 % (ref 0.0–1.9)
HCT: 36.5 % (ref 35.0–45.0)
Hemoglobin A2 - HGBRFX: 2.3 % (ref 2.2–3.2)
Hemoglobin: 10.9 g/dL — ABNORMAL LOW (ref 11.7–15.5)
Hgb A: 97.7 % (ref >96.0)
MCH: 24.5 pg — ABNORMAL LOW (ref 27.0–33.0)
MCV: 82 fL (ref 80.0–100.0)
RBC: 4.45 10*6/uL (ref 3.80–5.10)
RDW: 16.1 % — ABNORMAL HIGH (ref 11.0–15.0)

## 2020-06-25 LAB — HEMOGLOBIN A1C
Hgb A1c MFr Bld: 5 % of total Hgb (ref <5.7)
Mean Plasma Glucose: 97 (calc)
eAG (mmol/L): 5.4 (calc)

## 2020-06-25 LAB — HEPATITIS C ANTIBODY
Hepatitis C Ab: NONREACTIVE
SIGNAL TO CUT-OFF: 0.02 (ref <1.00)

## 2020-06-25 LAB — HIV ANTIBODY (ROUTINE TESTING W REFLEX): HIV 1&2 Ab, 4th Generation: NONREACTIVE

## 2020-06-26 LAB — CYTOLOGY - PAP
Chlamydia: NEGATIVE
Comment: NEGATIVE
Comment: NORMAL
Diagnosis: NEGATIVE
Neisseria Gonorrhea: NEGATIVE

## 2020-07-06 ENCOUNTER — Ambulatory Visit (INDEPENDENT_AMBULATORY_CARE_PROVIDER_SITE_OTHER): Payer: Medicaid Other | Admitting: *Deleted

## 2020-07-06 ENCOUNTER — Other Ambulatory Visit: Payer: Self-pay

## 2020-07-06 DIAGNOSIS — Z3A12 12 weeks gestation of pregnancy: Secondary | ICD-10-CM

## 2020-07-06 DIAGNOSIS — Z3480 Encounter for supervision of other normal pregnancy, unspecified trimester: Secondary | ICD-10-CM

## 2020-07-06 NOTE — Progress Notes (Signed)
Pt her for NIPS only today

## 2020-07-16 DIAGNOSIS — Z3480 Encounter for supervision of other normal pregnancy, unspecified trimester: Secondary | ICD-10-CM

## 2020-07-18 ENCOUNTER — Encounter: Payer: Self-pay | Admitting: *Deleted

## 2020-07-20 ENCOUNTER — Telehealth (INDEPENDENT_AMBULATORY_CARE_PROVIDER_SITE_OTHER): Payer: Medicaid Other | Admitting: Obstetrics and Gynecology

## 2020-07-20 ENCOUNTER — Other Ambulatory Visit: Payer: Self-pay

## 2020-07-20 ENCOUNTER — Encounter: Payer: Self-pay | Admitting: Obstetrics and Gynecology

## 2020-07-20 DIAGNOSIS — E669 Obesity, unspecified: Secondary | ICD-10-CM

## 2020-07-20 DIAGNOSIS — O99210 Obesity complicating pregnancy, unspecified trimester: Secondary | ICD-10-CM | POA: Diagnosis not present

## 2020-07-20 DIAGNOSIS — Z3A14 14 weeks gestation of pregnancy: Secondary | ICD-10-CM

## 2020-07-20 DIAGNOSIS — O99211 Obesity complicating pregnancy, first trimester: Secondary | ICD-10-CM | POA: Diagnosis not present

## 2020-07-20 DIAGNOSIS — Z3480 Encounter for supervision of other normal pregnancy, unspecified trimester: Secondary | ICD-10-CM

## 2020-07-20 MED ORDER — ASPIRIN EC 81 MG PO TBEC: 81.0000 mg | DELAYED_RELEASE_TABLET | Freq: Every day | ORAL | 11 refills | Status: DC

## 2020-07-20 NOTE — Progress Notes (Signed)
   TELEHEALTH OBSTETRICS VISIT ENCOUNTER NOTE  I connected with Rebecca Woodward on 07/20/20 at  9:30 AM EDT by telephone at home and verified that I am speaking with the correct person using two identifiers.   I discussed the limitations, risks, security and privacy concerns of performing an evaluation and management service by telephone and the availability of in person appointments. I also discussed with the patient that there may be a patient responsible charge related to this service. The patient expressed understanding and agreed to proceed.  Patient is at home, provider is in the office.   Subjective:  Rebecca Woodward is a 18 y.o. G2P1001 at [redacted]w[redacted]d being followed for ongoing prenatal care.  She is currently monitored for the following issues for this low-risk pregnancy and has Attention deficit hyperactivity disorder (ADHD); Epilepsy (HCC); Supervision of other normal pregnancy, antepartum; Obesity in pregnancy; and Anemia in pregnancy on their problem list.  Patient reports no complaints. Reports fetal movement. Denies any contractions, bleeding or leaking of fluid.   The following portions of the patient's history were reviewed and updated as appropriate: allergies, current medications, past family history, past medical history, past social history, past surgical history and problem list.   Objective:   General:  Alert, oriented and cooperative.   Mental Status: Normal mood and affect perceived. Normal judgment and thought content.  Rest of physical exam deferred due to type of encounter  Assessment and Plan:  Pregnancy: G2P1001 at [redacted]w[redacted]d 1. Supervision of other normal pregnancy, antepartum  - Does not have batteries for BP cuff, Will get batteries and check BP and send it through mychart.    2. Obesity in pregnancy  Low socioeconomic status: will start ASA today, RX sent   Preterm labor symptoms and general obstetric precautions including but not limited to vaginal bleeding,  contractions, leaking of fluid and fetal movement were reviewed in detail with the patient.  I discussed the assessment and treatment plan with the patient. The patient was provided an opportunity to ask questions and all were answered. The patient agreed with the plan and demonstrated an understanding of the instructions. The patient was advised to call back or seek an in-person office evaluation/go to MAU at [facility] for any urgent or concerning symptoms. Please refer to After Visit Summary for other counseling recommendations.   I provided 10 minutes of non-face-to-face time during this encounter.  No follow-ups on file.  No future appointments.  Venia Carbon, NP Center for [employer], [facility]

## 2020-07-20 NOTE — Patient Instructions (Signed)

## 2020-07-20 NOTE — Progress Notes (Signed)
Pt states she doesn't have scale for her weight and she doesn't have batteries for BP cuff. Pt will get batteries and send [place] a message with BP reading.

## 2020-08-17 ENCOUNTER — Other Ambulatory Visit: Payer: Self-pay

## 2020-08-17 ENCOUNTER — Encounter: Payer: Medicaid Other | Admitting: Certified Nurse Midwife

## 2020-08-17 ENCOUNTER — Other Ambulatory Visit: Payer: Self-pay | Admitting: Certified Nurse Midwife

## 2020-08-17 DIAGNOSIS — Z3480 Encounter for supervision of other normal pregnancy, unspecified trimester: Secondary | ICD-10-CM

## 2020-08-17 NOTE — Progress Notes (Signed)
Pt has not been taking weight and BP

## 2020-08-18 NOTE — Progress Notes (Signed)
Pt DNKA This encounter was created in error - please disregard.

## 2020-08-21 ENCOUNTER — Ambulatory Visit (INDEPENDENT_AMBULATORY_CARE_PROVIDER_SITE_OTHER): Payer: Medicaid Other | Admitting: Advanced Practice Midwife

## 2020-08-21 ENCOUNTER — Other Ambulatory Visit: Payer: Self-pay

## 2020-08-21 VITALS — BP 120/70 | HR 88 | Wt 212.0 lb

## 2020-08-21 DIAGNOSIS — Z3480 Encounter for supervision of other normal pregnancy, unspecified trimester: Secondary | ICD-10-CM

## 2020-08-21 NOTE — Progress Notes (Signed)
   PRENATAL VISIT NOTE  Subjective:  Rebecca Woodward is a 19 y.o. G2P1001 at [redacted]w[redacted]d being seen today for ongoing prenatal care.  She is currently monitored for the following issues for this low-risk pregnancy and has Attention deficit hyperactivity disorder (ADHD); Epilepsy (HCC); Supervision of other normal pregnancy, antepartum; Obesity in pregnancy; and Anemia in pregnancy on their problem list.  Patient reports no complaints.  Contractions: Not present. Vag. Bleeding: None.  Movement: Present. Denies leaking of fluid.   The following portions of the patient's history were reviewed and updated as appropriate: allergies, current medications, past family history, past medical history, past social history, past surgical history and problem list.   Objective:   Vitals:   08/21/20 1028  BP: 120/70  Pulse: 88  Weight: 212 lb (96.2 kg)    Fetal Status: Fetal Heart Rate (bpm): 151   Movement: Present     General:  Alert, oriented and cooperative. Patient is in no acute distress.  Skin: Skin is warm and dry. No rash noted.   Cardiovascular: Normal heart rate noted  Respiratory: Normal respiratory effort, no problems with respiration noted  Abdomen: Soft, gravid, appropriate for gestational age.  Pain/Pressure: Absent     Pelvic: Cervical exam deferred        Extremities: Normal range of motion.  Edema: None  Mental Status: Normal mood and affect. Normal behavior. Normal judgment and thought content.   Assessment and Plan:  Pregnancy: G2P1001 at [redacted]w[redacted]d 1. Supervision of other normal pregnancy, antepartum --Anticipatory guidance about next visits/weeks of pregnancy given. --Next visit in 4 weeks in the office --Anatomy US 11/12 - Alpha fetoprotein, maternal  Preterm labor symptoms and general obstetric precautions including but not limited to vaginal bleeding, contractions, leaking of fluid and fetal movement were reviewed in detail with the patient. Please refer to After Visit Summary  for other counseling recommendations.   Return in about 4 weeks (around 09/18/2020).  Future Appointments  Date Time Provider Department Center  09/07/2020  1:15 PM [facility] NURSE [facility]  09/07/2020  1:30 PM [facility] US3 [facility] [place] [facility]    Sharen Counter, CNM

## 2020-08-21 NOTE — Addendum Note (Signed)
Addended by: Kathie Dike on: 08/21/2020 10:55 AM   Modules accepted: Orders

## 2020-08-21 NOTE — Patient Instructions (Signed)

## 2020-08-22 LAB — ALPHA FETOPROTEIN, MATERNAL
AFP MoM: 0.66
AFP, Serum: 24.7 ng/mL
Calc'd Gestational Age: 18.6 weeks
Maternal Wt: 223 [lb_av]
Risk for ONTD: <1
Twins-AFP: 1

## 2020-08-22 LAB — TIQ-AOE

## 2020-09-07 ENCOUNTER — Other Ambulatory Visit: Payer: Self-pay | Admitting: *Deleted

## 2020-09-07 ENCOUNTER — Encounter: Payer: Self-pay | Admitting: Certified Nurse Midwife

## 2020-09-07 ENCOUNTER — Other Ambulatory Visit: Payer: Self-pay

## 2020-09-07 ENCOUNTER — Ambulatory Visit: Payer: Medicaid Other | Admitting: *Deleted

## 2020-09-07 ENCOUNTER — Ambulatory Visit: Payer: Medicaid Other | Attending: Certified Nurse Midwife

## 2020-09-07 ENCOUNTER — Encounter: Payer: Self-pay | Admitting: *Deleted

## 2020-09-07 DIAGNOSIS — O283 Abnormal ultrasonic finding on antenatal screening of mother: Secondary | ICD-10-CM | POA: Insufficient documentation

## 2020-09-07 DIAGNOSIS — O99212 Obesity complicating pregnancy, second trimester: Secondary | ICD-10-CM | POA: Diagnosis not present

## 2020-09-07 DIAGNOSIS — G40909 Epilepsy, unspecified, not intractable, without status epilepticus: Secondary | ICD-10-CM | POA: Diagnosis not present

## 2020-09-07 DIAGNOSIS — Z3480 Encounter for supervision of other normal pregnancy, unspecified trimester: Secondary | ICD-10-CM | POA: Insufficient documentation

## 2020-09-07 DIAGNOSIS — Z363 Encounter for antenatal screening for malformations: Secondary | ICD-10-CM

## 2020-09-07 DIAGNOSIS — O99012 Anemia complicating pregnancy, second trimester: Secondary | ICD-10-CM | POA: Diagnosis not present

## 2020-09-07 DIAGNOSIS — Z362 Encounter for other antenatal screening follow-up: Secondary | ICD-10-CM

## 2020-09-07 DIAGNOSIS — O99210 Obesity complicating pregnancy, unspecified trimester: Secondary | ICD-10-CM | POA: Diagnosis present

## 2020-09-07 DIAGNOSIS — Z3A21 21 weeks gestation of pregnancy: Secondary | ICD-10-CM

## 2020-09-15 ENCOUNTER — Emergency Department (INDEPENDENT_AMBULATORY_CARE_PROVIDER_SITE_OTHER)
Admission: EM | Admit: 2020-09-15 | Discharge: 2020-09-15 | Disposition: A | Discharge status: Home / Self Care | Payer: Medicaid Other | Source: Home / Self Care | Attending: Family Medicine | Admitting: Family Medicine

## 2020-09-15 ENCOUNTER — Other Ambulatory Visit: Payer: Self-pay

## 2020-09-15 DIAGNOSIS — J029 Acute pharyngitis, unspecified: Secondary | ICD-10-CM | POA: Diagnosis not present

## 2020-09-15 DIAGNOSIS — J069 Acute upper respiratory infection, unspecified: Secondary | ICD-10-CM

## 2020-09-15 LAB — POCT RAPID STREP A (OFFICE): Rapid Strep A Screen: NEGATIVE

## 2020-09-15 NOTE — ED Provider Notes (Signed)
[facility]    CSN: [id number] Arrival date & time: 09/15/20  1047      History   Chief Complaint Chief Complaint  Patient presents with  . Cough    HPI DARCHELLE NUNES is a 19 y.o. female.   About 8 days ago patient developed a migraine headache, cough, sneezing, and mild sinus congestion.  Five days ago she developed sore throat and mild right earache.  She denies pleuritic pain or shortness of breath.  She does not feel ill otherwise. She is now [redacted] weeks pregnant, and denies abdominal pain or vaginal bleeding.  The history is provided by the patient.    Past Medical History:  Diagnosis Date  . Migraines     Patient Active Problem List   Diagnosis Date Noted  . Abnormal fetal ultrasound 09/07/2020  . Anemia in pregnancy 06/25/2020  . Supervision of other normal pregnancy, antepartum 06/22/2020  . Obesity in pregnancy 06/22/2020  . Attention deficit hyperactivity disorder (ADHD) 07/24/2009  . Epilepsy (HCC) 07/24/2009    Past Surgical History:  Procedure Laterality Date  . NO PAST SURGERIES      OB History    Gravida  2   Para  1   Term  1   Preterm      AB      Living  1     SAB      TAB      Ectopic      Multiple  0   Live Births  1            Home Medications    Prior to Admission medications   Medication Sig Start Date End Date Taking? Authorizing Provider  acetaminophen (TYLENOL) 80 MG chewable tablet Chew 80 mg by mouth every 6 (six) hours as needed.    Yes [provider]  Prenatal Vit-Min-FA-Fish Oil ([employer] PRENATAL GUMMY) 0.4-113.5 MG CHEW Chew by mouth.    Yes [provider]  aspirin EC 81 MG tablet Take 1 tablet (81 mg total) by mouth daily. Swallow whole. 07/20/20   Rasch, Harolyn Rutherford, NP    Family History Family History  Problem Relation Age of Onset  . Hypertension Maternal Grandmother   . Diabetes Maternal Grandmother   . Diabetes Maternal Grandfather   . Hypertension Maternal  Grandfather     Social History Social History   Tobacco Use  . Smoking status: Never Smoker  . Smokeless tobacco: Never Used  Vaping Use  . Vaping Use: Never used  Substance Use Topics  . Alcohol use: Never  . Drug use: Never     Allergies   Amoxicillin-pot clavulanate   Review of Systems Review of Systems + sore throat + cough + sneezing No pleuritic pain No wheezing + nasal congestion + post-nasal drainage No sinus pain/pressure No itchy/red eyes ? right earache No hemoptysis No SOB No fever/chills No nausea No vomiting No abdominal pain No diarrhea No urinary symptoms No skin rash + fatigue No myalgias + headache Used OTC meds without relief   Physical Exam Triage Vital Signs ED Triage Vitals  Enc Vitals Group     BP 09/15/20 1226 (!) 139/94     Pulse Rate 09/15/20 1226 (!) 105     Resp --      Temp 09/15/20 1226 97.8 F (36.6 C)     Temp Source 09/15/20 1226 Oral     SpO2 09/15/20 1226 98 %     Weight 09/15/20 1225 212  lb (96.2 kg)     Height 09/15/20 1225 5' 4.5" (1.638 m)     Head Circumference --      Peak Flow --      Pain Score --      Pain Loc --      Pain Edu? --      Excl. in GC? --    No data found.  Updated Vital Signs BP (!) 139/94 (BP Location: Right Arm)   Pulse (!) 105   Temp 97.8 F (36.6 C) (Oral)   Ht 5' 4.5" (1.638 m)   Wt 96.2 kg   LMP 04/13/2020   SpO2 98%   BMI 35.83 kg/m   Visual Acuity Right Eye Distance:   Left Eye Distance:   Bilateral Distance:    Right Eye Near:   Left Eye Near:    Bilateral Near:     Physical Exam Nursing notes and Vital Signs reviewed. Appearance:  Patient appears stated age, and in no acute distress Eyes:  Pupils are equal, round, and reactive to light and accomodation.  Extraocular movement is intact.  Conjunctivae are not inflamed  Ears:  Canals normal.  Tympanic membranes normal.  Nose:  Mildly congested turbinates.  No sinus tenderness.  Pharynx:  Minimal erythema  Neck:  Supple.  No adenopathy.  Lungs:  Clear to auscultation.  Breath sounds are equal.  Moving air well. Heart:  Regular rate and rhythm without murmurs, rubs, or gallops.  Abdomen:  Nontender without masses or hepatosplenomegaly.  Bowel sounds are present.  No CVA or flank tenderness.  Extremities:  No edema.  Skin:  No rash present.   UC Treatments / Results  Labs (all labs ordered are listed, but only abnormal results are displayed) Labs Reviewed  STREP A DNA PROBE  NOVEL CORONAVIRUS, NAA  POCT RAPID STREP A (OFFICE) negative    EKG   Radiology No results found.  Procedures Procedures (including critical care time)  Medications Ordered in UC Medications - No data to display  Initial Impression / Assessment and Plan / UC Course  I have reviewed the triage vital signs and the nursing notes.  Pertinent labs & imaging results that were available during my care of the patient were reviewed by me and considered in my medical decision making (see chart for details).    Benign exam.  There is no evidence of bacterial infection today.  Treat symptomatically for now  COVID 19 PCR pending. Strep A DNA probe pending pending. Followup with Family Doctor if not improved in about 10 days.   Final Clinical Impressions(s) / UC Diagnoses   Final diagnoses:  Acute pharyngitis, unspecified etiology  Viral URI with cough     Discharge Instructions     If needed, my take plain guaifenesin (1200mg  extended release tabs such as Mucinex) twice daily, with plenty of water, for cough and congestion.  Get adequate rest.   Recommend using saline nasal spray several times daily and saline nasal irrigation (AYR is a common brand) for sinus congestion. Try warm salt water gargles for sore throat.  May take Delsym Cough Suppressant ("12 Hour Cough Relief") at bedtime for nighttime cough.   If symptoms become significantly worse during the night or over the weekend, proceed to the local  emergency room.       ED Prescriptions    None        , MD 09/24/20 1455

## 2020-09-15 NOTE — Discharge Instructions (Addendum)
If needed, my take plain guaifenesin (1200mg  extended release tabs such as Mucinex) twice daily, with plenty of water, for cough and congestion.  Get adequate rest.   Recommend using saline nasal spray several times daily and saline nasal irrigation (AYR is a common brand) for sinus congestion. Try warm salt water gargles for sore throat.  May take Delsym Cough Suppressant ("12 Hour Cough Relief") at bedtime for nighttime cough.   If symptoms become significantly worse during the night or over the weekend, proceed to the local emergency room.

## 2020-09-15 NOTE — ED Triage Notes (Signed)
Pt states that she has a cough, nasal congestion and sore throat. x5 days. Pt states that she has some ear pain in her right ear as well. Pt states that she is not vaccinated.

## 2020-09-16 LAB — STREP A DNA PROBE: Group A Strep Probe: NOT DETECTED

## 2020-09-16 LAB — NOVEL CORONAVIRUS, NAA: SARS-CoV-2, NAA: NOT DETECTED

## 2020-09-16 LAB — SARS-COV-2, NAA 2 DAY TAT

## 2020-09-18 ENCOUNTER — Encounter: Payer: Medicaid Other | Admitting: Advanced Practice Midwife

## 2020-09-26 ENCOUNTER — Ambulatory Visit (INDEPENDENT_AMBULATORY_CARE_PROVIDER_SITE_OTHER): Payer: Medicaid Other | Admitting: Obstetrics and Gynecology

## 2020-09-26 ENCOUNTER — Other Ambulatory Visit: Payer: Self-pay

## 2020-09-26 DIAGNOSIS — Z3482 Encounter for supervision of other normal pregnancy, second trimester: Secondary | ICD-10-CM

## 2020-09-26 DIAGNOSIS — Z7189 Other specified counseling: Secondary | ICD-10-CM | POA: Diagnosis not present

## 2020-09-26 DIAGNOSIS — Z3A23 23 weeks gestation of pregnancy: Secondary | ICD-10-CM

## 2020-09-26 DIAGNOSIS — Z23 Encounter for immunization: Secondary | ICD-10-CM

## 2020-09-26 DIAGNOSIS — Z3480 Encounter for supervision of other normal pregnancy, unspecified trimester: Secondary | ICD-10-CM

## 2020-09-26 NOTE — Progress Notes (Signed)
   PRENATAL VISIT NOTE  Subjective:  Rebecca Woodward is a 19 y.o. G2P1001 at [redacted]w[redacted]d being seen today for ongoing prenatal care.  She is currently monitored for the following issues for this low-risk pregnancy and has Attention deficit hyperactivity disorder (ADHD); Epilepsy (HCC); Supervision of other normal pregnancy, antepartum; Obesity in pregnancy; Anemia in pregnancy; and Abnormal fetal ultrasound on their problem list.  Patient reports no complaints.  Contractions: Not present. Vag. Bleeding: None.  Movement: Present. Denies leaking of fluid.   The following portions of the patient's history were reviewed and updated as appropriate: allergies, current medications, past family history, past medical history, past social history, past surgical history and problem list.   Objective:   Vitals:   09/26/20 0818  BP: 118/71  Pulse: 85  Weight: 215 lb (97.5 kg)    Fetal Status: Fetal Heart Rate (bpm): 145   Movement: Present     General:  Alert, oriented and cooperative. Patient is in no acute distress.  Skin: Skin is warm and dry. No rash noted.   Cardiovascular: Normal heart rate noted  Respiratory: Normal respiratory effort, no problems with respiration noted  Abdomen: Soft, gravid, appropriate for gestational age.  Pain/Pressure: Absent     Pelvic: Cervical exam deferred        Extremities: Normal range of motion.  Edema: None  Mental Status: Normal mood and affect. Normal behavior. Normal judgment and thought content.   Assessment and Plan:  Pregnancy: G2P1001 at [redacted]w[redacted]d   1. Supervision of other normal pregnancy, antepartum  Doing well Unsure about Bc following delivery  Continue BASA Discussed Covid -19 vaccine.     COVID-19 Vaccine Counseling: The patient was counseled on the potential benefits and lack of known risks of COVID vaccination, during pregnancy and breastfeeding, during today's visit. The patient's questions and concerns were addressed today, including  safety of the vaccination and potential side effects as they have been published by ACOG and SMFM. The patient has been informed that there have not been any documented vaccine related injuries, deaths or birth defects to infant or mom after receiving the COVID-19 vaccine to date. The patient has been made aware that although she is not at increased risk of contracting COVID-19 during pregnancy, she is at increased risk of developing severe disease and complications if she contracts COVID-19 while pregnant. All patient questions were addressed during our visit today. The patient is still unsure of her decision for vaccination.   Preterm labor symptoms and general obstetric precautions including but not limited to vaginal bleeding, contractions, leaking of fluid and fetal movement were reviewed in detail with the patient. Please refer to After Visit Summary for other counseling recommendations.   No follow-ups on file.  Future Appointments  Date Time Provider Department Center  10/05/2020  9:30 AM [facility] NURSE [facility]  10/05/2020  9:45 AM [facility] US4 [facility]  10/24/2020  9:50 AM Leftwich-Kirby, Wilmer Floor, CNM [facility]    Venia Carbon, NP

## 2020-09-26 NOTE — Progress Notes (Signed)
Pt is not taking her BASA

## 2020-10-05 ENCOUNTER — Ambulatory Visit: Payer: Medicaid Other

## 2020-10-23 ENCOUNTER — Other Ambulatory Visit: Payer: Self-pay | Admitting: *Deleted

## 2020-10-23 ENCOUNTER — Other Ambulatory Visit: Payer: Self-pay

## 2020-10-23 ENCOUNTER — Encounter: Payer: Self-pay | Admitting: *Deleted

## 2020-10-23 ENCOUNTER — Ambulatory Visit: Payer: Medicaid Other | Attending: Obstetrics and Gynecology | Admitting: *Deleted

## 2020-10-23 ENCOUNTER — Ambulatory Visit (HOSPITAL_BASED_OUTPATIENT_CLINIC_OR_DEPARTMENT_OTHER): Payer: Medicaid Other

## 2020-10-23 ENCOUNTER — Other Ambulatory Visit: Payer: Self-pay | Admitting: Obstetrics

## 2020-10-23 DIAGNOSIS — O99212 Obesity complicating pregnancy, second trimester: Secondary | ICD-10-CM

## 2020-10-23 DIAGNOSIS — E669 Obesity, unspecified: Secondary | ICD-10-CM | POA: Diagnosis not present

## 2020-10-23 DIAGNOSIS — D649 Anemia, unspecified: Secondary | ICD-10-CM | POA: Diagnosis not present

## 2020-10-23 DIAGNOSIS — Z362 Encounter for other antenatal screening follow-up: Secondary | ICD-10-CM

## 2020-10-23 DIAGNOSIS — O99352 Diseases of the nervous system complicating pregnancy, second trimester: Secondary | ICD-10-CM

## 2020-10-23 DIAGNOSIS — Z3480 Encounter for supervision of other normal pregnancy, unspecified trimester: Secondary | ICD-10-CM

## 2020-10-23 DIAGNOSIS — O99012 Anemia complicating pregnancy, second trimester: Secondary | ICD-10-CM

## 2020-10-23 DIAGNOSIS — Z3A27 27 weeks gestation of pregnancy: Secondary | ICD-10-CM | POA: Diagnosis not present

## 2020-10-23 DIAGNOSIS — G40909 Epilepsy, unspecified, not intractable, without status epilepticus: Secondary | ICD-10-CM | POA: Diagnosis not present

## 2020-10-23 DIAGNOSIS — O365920 Maternal care for other known or suspected poor fetal growth, second trimester, not applicable or unspecified: Secondary | ICD-10-CM | POA: Diagnosis not present

## 2020-10-23 DIAGNOSIS — O283 Abnormal ultrasonic finding on antenatal screening of mother: Secondary | ICD-10-CM

## 2020-10-23 DIAGNOSIS — O99210 Obesity complicating pregnancy, unspecified trimester: Secondary | ICD-10-CM

## 2020-10-24 ENCOUNTER — Encounter: Payer: Medicaid Other | Admitting: Advanced Practice Midwife

## 2020-10-27 NOTE — L&D Delivery Note (Signed)
Rebecca Woodward is a 20 y.o. G2P1001 s/p SVD at [redacted]w[redacted]d. She was admitted for IOL for IUGR.   ROM: 6h 21m with clear fluid GBS Status: Negative Maximum Maternal Temperature: 98.3  Labor Progress: Called to room and patient was complete and pushing.She progressed with augmentation (FB, cytotec, AROM) to complete and pushed 21 minutes to deliver.  Head delivered ROA. No nuchal cord present. Shoulder and body delivered in usual fashion. Infant with spontaneous cry, placed on mother's abdomen, dried and stimulated. Cord clamped x 2 after 1-minute delay, and cut by FOB. Cord blood drawn.   Placenta delivered spontaneously with gentle cord traction. Fundus firm with massage and Pitocin. Labia, perineum, vagina, and cervix inspected inspected with bilateral labial and 1st degree- not repaired, hemostatic. Mom and baby stable prior to transfer to postpartum. She plans on breastfeeding. She requests OCPs for birth control.  Delivery Note At 12:51 AM a viable and healthy female was delivered via Vaginal, Spontaneous (Presentation: Right Occiput Anterior).  APGAR: 9, 9; weight 6 lb 0.7 oz (2741 g).  Placenta status: Spontaneous, Intact.  Cord: 3 vessels with the following complications: None.   Anesthesia: Epidural Episiotomy: None Lacerations: Labial;1st degree Suture Repair: none Est. Blood Loss (mL): 150  Mom to postpartum.  Baby to Couplet care / Skin to Skin.   Sharyon Cable, CNM 01/12/21, 2:14 AM

## 2020-10-31 ENCOUNTER — Other Ambulatory Visit: Payer: Medicaid Other

## 2020-11-07 ENCOUNTER — Telehealth: Payer: Self-pay

## 2020-11-07 ENCOUNTER — Telehealth (INDEPENDENT_AMBULATORY_CARE_PROVIDER_SITE_OTHER): Payer: Medicaid Other | Admitting: Certified Nurse Midwife

## 2020-11-07 ENCOUNTER — Encounter: Payer: Medicaid Other | Admitting: Certified Nurse Midwife

## 2020-11-07 DIAGNOSIS — O99210 Obesity complicating pregnancy, unspecified trimester: Secondary | ICD-10-CM

## 2020-11-07 DIAGNOSIS — O283 Abnormal ultrasonic finding on antenatal screening of mother: Secondary | ICD-10-CM

## 2020-11-07 DIAGNOSIS — Z3480 Encounter for supervision of other normal pregnancy, unspecified trimester: Secondary | ICD-10-CM

## 2020-11-07 DIAGNOSIS — O99012 Anemia complicating pregnancy, second trimester: Secondary | ICD-10-CM

## 2020-11-07 NOTE — Telephone Encounter (Signed)
Called Rebecca Woodward to do virtual visit. Rebecca Woodward is sick with cough, fever of 103, congestion and sore throat. I recommended Rebecca Woodward go get tested for COVID and informed her of testing sites. Rebecca Woodward cannot get MyChart access to get on virtual visit so appt was cancelled per Edd Arbour, CNM. I explained to Rebecca Woodward that we can schedule her another day to come in after covid testing and after she isn't sick anymore. I also explained the importance of the GTT and if that was the reason why she didn't want to come to appointments then we could work something out (per Edd Arbour, CNM) Rebecca Woodward expressed understanding.

## 2020-11-07 NOTE — Progress Notes (Signed)
Pt has not taken BP or weight. Pt has fever, cough, congestion and sore throat.

## 2020-11-08 ENCOUNTER — Ambulatory Visit: Payer: Medicaid Other

## 2020-11-08 ENCOUNTER — Ambulatory Visit: Payer: Medicaid Other | Attending: Obstetrics and Gynecology

## 2020-11-09 NOTE — Progress Notes (Signed)
Pt scheduled for in-person visit but has s/sx of Covid. Has not been evaluated by a provider since 09/26/21 so visit switched to virtual. Pt could not get her MyChart to work so visit canceled and will reschedule to in-person in 10 days. Advised patient to go get testing for Covid-19.  Edd Arbour, CNM, MSN, IBCLC Certified Nurse Midwife, [facility]

## 2020-11-15 ENCOUNTER — Other Ambulatory Visit: Payer: Self-pay | Admitting: Obstetrics and Gynecology

## 2020-11-15 ENCOUNTER — Other Ambulatory Visit: Payer: Self-pay | Admitting: *Deleted

## 2020-11-15 ENCOUNTER — Ambulatory Visit: Payer: Medicaid Other | Admitting: *Deleted

## 2020-11-15 ENCOUNTER — Ambulatory Visit (HOSPITAL_BASED_OUTPATIENT_CLINIC_OR_DEPARTMENT_OTHER): Payer: Medicaid Other | Admitting: *Deleted

## 2020-11-15 ENCOUNTER — Other Ambulatory Visit: Payer: Self-pay

## 2020-11-15 ENCOUNTER — Encounter: Payer: Self-pay | Admitting: *Deleted

## 2020-11-15 ENCOUNTER — Ambulatory Visit: Payer: Medicaid Other | Attending: Certified Nurse Midwife

## 2020-11-15 DIAGNOSIS — O365930 Maternal care for other known or suspected poor fetal growth, third trimester, not applicable or unspecified: Secondary | ICD-10-CM | POA: Insufficient documentation

## 2020-11-15 DIAGNOSIS — O99210 Obesity complicating pregnancy, unspecified trimester: Secondary | ICD-10-CM

## 2020-11-15 DIAGNOSIS — Z3A30 30 weeks gestation of pregnancy: Secondary | ICD-10-CM | POA: Diagnosis not present

## 2020-11-15 DIAGNOSIS — O99013 Anemia complicating pregnancy, third trimester: Secondary | ICD-10-CM | POA: Insufficient documentation

## 2020-11-15 DIAGNOSIS — Z3480 Encounter for supervision of other normal pregnancy, unspecified trimester: Secondary | ICD-10-CM | POA: Insufficient documentation

## 2020-11-15 DIAGNOSIS — G40909 Epilepsy, unspecified, not intractable, without status epilepticus: Secondary | ICD-10-CM | POA: Diagnosis not present

## 2020-11-15 DIAGNOSIS — O283 Abnormal ultrasonic finding on antenatal screening of mother: Secondary | ICD-10-CM

## 2020-11-15 DIAGNOSIS — D649 Anemia, unspecified: Secondary | ICD-10-CM | POA: Insufficient documentation

## 2020-11-15 DIAGNOSIS — O365920 Maternal care for other known or suspected poor fetal growth, second trimester, not applicable or unspecified: Secondary | ICD-10-CM

## 2020-11-15 DIAGNOSIS — O358XX0 Maternal care for other (suspected) fetal abnormality and damage, not applicable or unspecified: Secondary | ICD-10-CM | POA: Insufficient documentation

## 2020-11-15 DIAGNOSIS — O99213 Obesity complicating pregnancy, third trimester: Secondary | ICD-10-CM | POA: Diagnosis not present

## 2020-11-15 DIAGNOSIS — O99353 Diseases of the nervous system complicating pregnancy, third trimester: Secondary | ICD-10-CM | POA: Insufficient documentation

## 2020-11-15 DIAGNOSIS — E669 Obesity, unspecified: Secondary | ICD-10-CM

## 2020-11-15 NOTE — Procedures (Signed)
Rebecca Woodward 03-21-2001 [redacted]w[redacted]d  Fetus A Non-Stress Test Interpretation for 11/15/20  Indication: IUGR  Fetal Heart Rate A Mode: External Baseline Rate (A): 145 bpm Variability: Moderate Accelerations: 10 x 10 Decelerations: Variable Multiple birth?: No  Uterine Activity Mode: Palpation,Toco Contraction Frequency (min): none noted Resting Tone Palpated: Relaxed Resting Time: Adequate  Interpretation (Fetal Testing) Nonstress Test Interpretation: Reactive Comments: Reviewed tracing with Dr. Parke Poisson

## 2020-11-21 ENCOUNTER — Ambulatory Visit (INDEPENDENT_AMBULATORY_CARE_PROVIDER_SITE_OTHER): Payer: Medicaid Other | Admitting: Obstetrics and Gynecology

## 2020-11-21 ENCOUNTER — Other Ambulatory Visit: Payer: Self-pay

## 2020-11-21 VITALS — BP 105/67 | HR 90 | Wt 222.0 lb

## 2020-11-21 DIAGNOSIS — Z3A31 31 weeks gestation of pregnancy: Secondary | ICD-10-CM

## 2020-11-21 NOTE — Progress Notes (Signed)
° °  PRENATAL VISIT NOTE  Subjective:  Rebecca Woodward is a 20 y.o. G2P1001 at [redacted]w[redacted]d being seen today for ongoing prenatal care.  She is currently monitored for the following issues for this low-risk pregnancy and has Attention deficit hyperactivity disorder (ADHD); Epilepsy (HCC); Supervision of other normal pregnancy, antepartum; Obesity in pregnancy; Anemia in pregnancy; Abnormal fetal ultrasound; and Small for gestational age on their problem list.  Patient reports dizziness .  Contractions: Not present. Vag. Bleeding: None.  Movement: Present. Denies leaking of fluid.   The following portions of the patient's history were reviewed and updated as appropriate: allergies, current medications, past family history, past medical history, past social history, past surgical history and problem list.   Objective:   Vitals:   11/21/20 0922  BP: 105/67  Pulse: 90  Weight: 222 lb (100.7 kg)    Fetal Status: Fetal Heart Rate (bpm): 153 Fundal Height: 29 cm Movement: Present     General:  Alert, oriented and cooperative. Patient is in no acute distress.  Skin: Skin is warm and dry. No rash noted.   Cardiovascular: Normal heart rate noted  Respiratory: Normal respiratory effort, no problems with respiration noted  Abdomen: Soft, gravid, appropriate for gestational age.  Pain/Pressure: Absent     Pelvic: Cervical exam deferred        Extremities: Normal range of motion.  Edema: None  Mental Status: Normal mood and affect. Normal behavior. Normal judgment and thought content.   Assessment and Plan:  Pregnancy: G2P1001 at [redacted]w[redacted]d  1. [redacted] weeks gestation of pregnancy  - HIV antibody (with reflex) - CBC - RPR - 2Hr GTT w/ 1 Hr Carpenter 75 g - Increase oral fluid intake, change positions slowly.  - Continue BASA  2. Small for gestational age  Continue [place] with MFM  Preterm labor symptoms and general obstetric precautions including but not limited to vaginal bleeding, contractions, leaking  of fluid and fetal movement were reviewed in detail with the patient. Please refer to After Visit Summary for other counseling recommendations.   No follow-ups on file.  Future Appointments  Date Time Provider Department Center  11/28/2020  2:45 PM [facility] NURSE [facility]  11/28/2020  3:00 PM [facility] US1 [facility]  12/05/2020  8:30 AM Donnell Wion, Harolyn Rutherford, NP [facility]  12/05/2020 10:30 AM [facility] NURSE [facility]  12/05/2020 10:45 AM [facility] US4 [facility]  12/12/2020  8:15 AM [facility] NURSE [facility]  12/12/2020  8:30 AM [facility] US3 [facility]  12/19/2020  9:00 AM [facility] NURSE [facility]  12/19/2020  9:15 AM [facility] US2 [facility]    Venia Carbon, NP

## 2020-11-22 LAB — CBC
HCT: 32.1 % — ABNORMAL LOW (ref 35.0–45.0)
Hemoglobin: 10.6 g/dL — ABNORMAL LOW (ref 11.7–15.5)
MCH: 27.4 pg (ref 27.0–33.0)
MCHC: 33 g/dL (ref 32.0–36.0)
MCV: 82.9 fL (ref 80.0–100.0)
MPV: 10.5 fL (ref 7.5–12.5)
Platelets: 325 10*3/uL (ref 140–400)
RBC: 3.87 10*6/uL (ref 3.80–5.10)
RDW: 15.2 % — ABNORMAL HIGH (ref 11.0–15.0)
WBC: 8.6 10*3/uL (ref 3.8–10.8)

## 2020-11-22 LAB — 2HR GTT W 1 HR, CARPENTER, 75 G
Glucose, 1 Hr, Gest: 117 mg/dL (ref 65–179)
Glucose, 2 Hr, Gest: 111 mg/dL (ref 65–152)
Glucose, Fasting, Gest: 72 mg/dL (ref 65–91)

## 2020-11-22 LAB — HIV ANTIBODY (ROUTINE TESTING W REFLEX): HIV 1&2 Ab, 4th Generation: NONREACTIVE

## 2020-11-22 LAB — RPR: RPR Ser Ql: NONREACTIVE

## 2020-11-28 ENCOUNTER — Ambulatory Visit: Payer: Medicaid Other | Admitting: *Deleted

## 2020-11-28 ENCOUNTER — Ambulatory Visit: Payer: Medicaid Other | Attending: Obstetrics

## 2020-11-28 ENCOUNTER — Other Ambulatory Visit: Payer: Self-pay

## 2020-11-28 ENCOUNTER — Encounter: Payer: Self-pay | Admitting: *Deleted

## 2020-11-28 DIAGNOSIS — G40909 Epilepsy, unspecified, not intractable, without status epilepticus: Secondary | ICD-10-CM | POA: Diagnosis not present

## 2020-11-28 DIAGNOSIS — Z3A32 32 weeks gestation of pregnancy: Secondary | ICD-10-CM | POA: Insufficient documentation

## 2020-11-28 DIAGNOSIS — O365990 Maternal care for other known or suspected poor fetal growth, unspecified trimester, not applicable or unspecified: Secondary | ICD-10-CM | POA: Insufficient documentation

## 2020-11-28 DIAGNOSIS — O283 Abnormal ultrasonic finding on antenatal screening of mother: Secondary | ICD-10-CM | POA: Insufficient documentation

## 2020-11-28 DIAGNOSIS — O99210 Obesity complicating pregnancy, unspecified trimester: Secondary | ICD-10-CM

## 2020-11-28 DIAGNOSIS — O99013 Anemia complicating pregnancy, third trimester: Secondary | ICD-10-CM | POA: Diagnosis not present

## 2020-11-28 DIAGNOSIS — O365930 Maternal care for other known or suspected poor fetal growth, third trimester, not applicable or unspecified: Secondary | ICD-10-CM | POA: Diagnosis present

## 2020-11-28 DIAGNOSIS — O358XX0 Maternal care for other (suspected) fetal abnormality and damage, not applicable or unspecified: Secondary | ICD-10-CM | POA: Diagnosis not present

## 2020-11-28 DIAGNOSIS — O99213 Obesity complicating pregnancy, third trimester: Secondary | ICD-10-CM | POA: Diagnosis not present

## 2020-11-28 DIAGNOSIS — Z3480 Encounter for supervision of other normal pregnancy, unspecified trimester: Secondary | ICD-10-CM

## 2020-12-01 ENCOUNTER — Other Ambulatory Visit: Payer: Self-pay

## 2020-12-01 ENCOUNTER — Encounter (HOSPITAL_COMMUNITY): Payer: Self-pay | Admitting: Obstetrics and Gynecology

## 2020-12-01 ENCOUNTER — Inpatient Hospital Stay (HOSPITAL_COMMUNITY)
Admission: AD | Admit: 2020-12-01 | Discharge: 2020-12-01 | Disposition: A | Discharge status: Home / Self Care | Payer: Medicaid Other | Attending: Obstetrics and Gynecology | Admitting: Obstetrics and Gynecology

## 2020-12-01 DIAGNOSIS — O283 Abnormal ultrasonic finding on antenatal screening of mother: Secondary | ICD-10-CM

## 2020-12-01 DIAGNOSIS — Z3480 Encounter for supervision of other normal pregnancy, unspecified trimester: Secondary | ICD-10-CM

## 2020-12-01 DIAGNOSIS — Z7982 Long term (current) use of aspirin: Secondary | ICD-10-CM | POA: Diagnosis not present

## 2020-12-01 DIAGNOSIS — Z3A33 33 weeks gestation of pregnancy: Secondary | ICD-10-CM | POA: Insufficient documentation

## 2020-12-01 DIAGNOSIS — O99210 Obesity complicating pregnancy, unspecified trimester: Secondary | ICD-10-CM

## 2020-12-01 DIAGNOSIS — O368130 Decreased fetal movements, third trimester, not applicable or unspecified: Secondary | ICD-10-CM

## 2020-12-01 DIAGNOSIS — Z3689 Encounter for other specified antenatal screening: Secondary | ICD-10-CM | POA: Diagnosis not present

## 2020-12-01 DIAGNOSIS — Z880 Allergy status to penicillin: Secondary | ICD-10-CM | POA: Insufficient documentation

## 2020-12-01 DIAGNOSIS — O368120 Decreased fetal movements, second trimester, not applicable or unspecified: Secondary | ICD-10-CM

## 2020-12-01 NOTE — MAU Note (Signed)
Pt here today with c/o DFM since 0800.   Pt reports that she was told by her OB provider that if she did not feel baby move to come in because the "baby's cord is not getting enough blood".

## 2020-12-01 NOTE — Discharge Instructions (Signed)
Fetal Movement Counts Patient Name: ________________________________________________ Patient Due Date: ____________________  What is a fetal movement count? A fetal movement count is the number of times that you feel your baby move during a certain amount of time. This may also be called a fetal kick count. A fetal movement count is recommended for every pregnant woman. You may be asked to start counting fetal movements as early as week 28 of your pregnancy. Pay attention to when your baby is most active. You may notice your baby's sleep and wake cycles. You may also notice things that make your baby move more. You should do a fetal movement count:  When your baby is normally most active.  At the same time each day. A good time to count movements is while you are resting, after having something to eat and drink. How do I count fetal movements? 1. Find a quiet, comfortable area. Sit, or lie down on your side. 2. Write down the date, the start time and stop time, and the number of movements that you felt between those two times. Take this information with you to your health care visits. 3. Write down your start time when you feel the first movement. 4. Count kicks, flutters, swishes, rolls, and jabs. You should feel at least 10 movements. 5. You may stop counting after you have felt 10 movements, or if you have been counting for 2 hours. Write down the stop time. 6. If you do not feel 10 movements in 2 hours, contact your health care provider for further instructions. Your health care provider may want to do additional tests to assess your baby's well-being. Contact a health care provider if:  You feel fewer than 10 movements in 2 hours.  Your baby is not moving like he or she usually does. Date: ____________ Start time: ____________ Stop time: ____________ Movements: ____________ Date: ____________ Start time: ____________ Stop time: ____________ Movements: ____________ Date: ____________  Start time: ____________ Stop time: ____________ Movements: ____________ Date: ____________ Start time: ____________ Stop time: ____________ Movements: ____________ Date: ____________ Start time: ____________ Stop time: ____________ Movements: ____________ Date: ____________ Start time: ____________ Stop time: ____________ Movements: ____________ Date: ____________ Start time: ____________ Stop time: ____________ Movements: ____________ Date: ____________ Start time: ____________ Stop time: ____________ Movements: ____________ Date: ____________ Start time: ____________ Stop time: ____________ Movements: ____________ This information is not intended to replace advice given to you by your health care provider. Make sure you discuss any questions you have with your health care provider. Document Revised: 06/02/2019 Document Reviewed: 06/02/2019 Elsevier Patient Education  2021 [employer]  

## 2020-12-01 NOTE — MAU Provider Note (Incomplete)
History     CSN: [id number]  Arrival date and time: 12/01/20 2109   Event Date/Time   First Provider Initiated Contact with Patient 12/01/20 2146      Chief Complaint  Patient presents with  . Decreased Fetal Movement   Rebecca Woodward is a 20 y.o. G2P1001 at [redacted]w[redacted]d who receives care at [facility]  She presents today for Decreased Fetal Movement.  She reports she last felt movement around 7 or 8 am this morning.  She states she has "drunken soda, slushie, eaten chocolate, moved around, moved my stomach around, and just relaxing."  She reports she did not feel any movement, but has been experiencing movement since arrival.  Patient denies abdominal cramping or contractions as well as vaginal concerns including abnormal discharge, leaking, or bleeding.     OB History    Gravida  2   Para  1   Term  1   Preterm      AB      Living  1     SAB      IAB      Ectopic      Multiple  0   Live Births  1           Past Medical History:  Diagnosis Date  . Migraines     Past Surgical History:  Procedure Laterality Date  . NO PAST SURGERIES      Family History  Problem Relation Age of Onset  . Hypertension Maternal Grandmother   . Diabetes Maternal Grandmother   . Diabetes Maternal Grandfather   . Hypertension Maternal Grandfather     Social History   Tobacco Use  . Smoking status: Never Smoker  . Smokeless tobacco: Never Used  Vaping Use  . Vaping Use: Never used  Substance Use Topics  . Alcohol use: Never  . Drug use: Never    Allergies:  Allergies  Allergen Reactions  . Amoxicillin-Pot Clavulanate Diarrhea and Nausea And Vomiting    Medications Prior to Admission  Medication Sig Dispense Refill Last Dose  . acetaminophen (TYLENOL) 80 MG chewable tablet Chew 80 mg by mouth every 6 (six) hours as needed.    Past Month at Unknown time  . aspirin EC 81 MG tablet Take 1 tablet (81 mg total) by mouth daily. Swallow whole. 60 tablet 11  12/01/2020 at Unknown time  . Prenatal Vit-Min-FA-Fish Oil ([employer] PRENATAL GUMMY) 0.4-113.5 MG CHEW Chew by mouth.    12/01/2020 at Unknown time    Review of Systems  Constitutional: Negative for chills and fever.  Respiratory: Positive for cough. Negative for shortness of breath.   Gastrointestinal: Negative for abdominal pain, nausea and vomiting.  Genitourinary: Negative for difficulty urinating, dysuria, pelvic pain, vaginal bleeding and vaginal discharge.  Musculoskeletal: Positive for back pain (Middle-None currently).  Neurological: Positive for dizziness. Negative for light-headedness and headaches.   Physical Exam   Blood pressure 130/76, pulse 83, temperature 98.3 F (36.8 C), temperature source Oral, resp. rate 17, weight 102.1 kg, last menstrual period 04/13/2020, SpO2 100 %, unknown if currently breastfeeding.  Physical Exam Constitutional:      Appearance: Normal appearance.  HENT:     Head: Normocephalic and atraumatic.  Eyes:     Conjunctiva/sclera: Conjunctivae normal.  Cardiovascular:     Rate and Rhythm: Normal rate.  Skin:    General: Skin is warm and dry.  Neurological:     Mental Status: She is alert.  Psychiatric:  Mood and Affect: Mood normal.        Behavior: Behavior normal.        Thought Content: Thought content normal.     Fetal Assessment 125 bpm, Mod Var, -Decels, +Accels Toco: Irritability  MAU Course  No results found for this or any previous visit (from the past 24 hour(s)). No results found.  MDM PE Labs:  EFM  Assessment and Plan  20 year old G2P1001  SIUP at 33.1weeks Cat I FT DFM   -Exam findings discussed. Cherre Robins MSN, CNM 12/01/2020, 9:46 PM   Reassessment (10:47 PM)

## 2020-12-01 NOTE — Progress Notes (Signed)
2148 Fetal kick count started. Button given to patient

## 2020-12-01 NOTE — Progress Notes (Signed)
From 2148-2238 patient reported 39 movements of fetus.

## 2020-12-01 NOTE — MAU Provider Note (Signed)
History     CSN: [id number]  Arrival date and time: 12/01/20 2109   Event Date/Time   First Provider Initiated Contact with Patient 12/01/20 2146      Chief Complaint  Patient presents with  . Decreased Fetal Movement   Rebecca Woodward is a 20 y.o. G2P1001 at [redacted]w[redacted]d who receives care at [facility].  She presents today for Decreased Fetal Movement.  She reports she last felt movement around 7 or 8 am this morning.  She states she has "drunken soda, slushie, eaten chocolate, moved around, moved my stomach around, and just relaxing."  She reports she did not feel any movement, but has been experiencing movement since arrival.  Patient denies abdominal cramping or contractions as well as vaginal concerns including abnormal discharge, leaking, or bleeding.     OB History    Gravida  2   Para  1   Term  1   Preterm      AB      Living  1     SAB      IAB      Ectopic      Multiple  0   Live Births  1           Past Medical History:  Diagnosis Date  . Migraines     Past Surgical History:  Procedure Laterality Date  . NO PAST SURGERIES      Family History  Problem Relation Age of Onset  . Hypertension Maternal Grandmother   . Diabetes Maternal Grandmother   . Diabetes Maternal Grandfather   . Hypertension Maternal Grandfather     Social History   Tobacco Use  . Smoking status: Never Smoker  . Smokeless tobacco: Never Used  Vaping Use  . Vaping Use: Never used  Substance Use Topics  . Alcohol use: Never  . Drug use: Never    Allergies:  Allergies  Allergen Reactions  . Amoxicillin-Pot Clavulanate Diarrhea and Nausea And Vomiting    Medications Prior to Admission  Medication Sig Dispense Refill Last Dose  . acetaminophen (TYLENOL) 80 MG chewable tablet Chew 80 mg by mouth every 6 (six) hours as needed.    Past Month at Unknown time  . aspirin EC 81 MG tablet Take 1 tablet (81 mg total) by mouth daily. Swallow whole. 60 tablet 11  12/01/2020 at Unknown time  . Prenatal Vit-Min-FA-Fish Oil ([employer] PRENATAL GUMMY) 0.4-113.5 MG CHEW Chew by mouth.    12/01/2020 at Unknown time    Review of Systems  Constitutional: Negative for chills and fever.  Respiratory: Positive for cough. Negative for shortness of breath.   Gastrointestinal: Negative for abdominal pain, nausea and vomiting.  Genitourinary: Negative for difficulty urinating, dysuria, pelvic pain, vaginal bleeding and vaginal discharge.  Musculoskeletal: Positive for back pain (Middle-None currently).  Neurological: Positive for dizziness. Negative for light-headedness and headaches.   Physical Exam   Blood pressure 130/76, pulse 83, temperature 98.3 F (36.8 C), temperature source Oral, resp. rate 17, weight 102.1 kg, last menstrual period 04/13/2020, SpO2 100 %, unknown if currently breastfeeding.  Physical Exam Constitutional:      Appearance: Normal appearance.  HENT:     Head: Normocephalic and atraumatic.  Eyes:     Conjunctiva/sclera: Conjunctivae normal.  Cardiovascular:     Rate and Rhythm: Normal rate.  Pulmonary:     Effort: Pulmonary effort is normal.     Breath sounds: Normal breath sounds.  Abdominal:     Palpations: Abdomen is  soft.     Comments: Gravid  Musculoskeletal:        General: Normal range of motion.  Skin:    General: Skin is warm and dry.  Neurological:     Mental Status: She is alert and oriented to person, place, and time.  Psychiatric:        Mood and Affect: Mood normal.        Behavior: Behavior normal.        Thought Content: Thought content normal.     Fetal Assessment 125 bpm, Mod Var, -Decels, +Accels Toco: Irritability  MAU Course  No results found for this or any previous visit (from the past 24 hour(s)). No results found.  MDM PE Labs: None EFM  Assessment and Plan  20 year old G2P1001  SIUP at 33.1weeks Cat I FT DFM   -Exam findings discussed. -Discussed fetal movement patterns and when to be  concerned. -Reviewed recent BPP which was 8/8 on 11/28/2020. -Informed that this is consistent with fetal well being for 7 days -NST reactive now. -Will monitor and reassess.    Cherre Robins MSN, CNM 12/01/2020, 9:46 PM   Reassessment (10:47 PM)  -NST remains reactive. -Encouraged to call or return to MAU if symptoms worsen or with the onset of new symptoms. -Discharged to home in stable condition.  Cherre Robins MSN, CNM Advanced Practice Provider, [facility] [employer]

## 2020-12-05 ENCOUNTER — Other Ambulatory Visit: Payer: Self-pay

## 2020-12-05 ENCOUNTER — Ambulatory Visit (INDEPENDENT_AMBULATORY_CARE_PROVIDER_SITE_OTHER): Payer: Medicaid Other | Admitting: Obstetrics and Gynecology

## 2020-12-05 ENCOUNTER — Ambulatory Visit: Payer: Medicaid Other | Attending: Obstetrics

## 2020-12-05 ENCOUNTER — Encounter: Payer: Self-pay | Admitting: *Deleted

## 2020-12-05 ENCOUNTER — Ambulatory Visit: Payer: Medicaid Other | Admitting: *Deleted

## 2020-12-05 VITALS — BP 118/65 | HR 63 | Wt 224.0 lb

## 2020-12-05 DIAGNOSIS — O283 Abnormal ultrasonic finding on antenatal screening of mother: Secondary | ICD-10-CM | POA: Diagnosis not present

## 2020-12-05 DIAGNOSIS — O99353 Diseases of the nervous system complicating pregnancy, third trimester: Secondary | ICD-10-CM | POA: Diagnosis not present

## 2020-12-05 DIAGNOSIS — Z3A33 33 weeks gestation of pregnancy: Secondary | ICD-10-CM

## 2020-12-05 DIAGNOSIS — O99210 Obesity complicating pregnancy, unspecified trimester: Secondary | ICD-10-CM

## 2020-12-05 DIAGNOSIS — Z3480 Encounter for supervision of other normal pregnancy, unspecified trimester: Secondary | ICD-10-CM

## 2020-12-05 DIAGNOSIS — G40909 Epilepsy, unspecified, not intractable, without status epilepticus: Secondary | ICD-10-CM | POA: Insufficient documentation

## 2020-12-05 DIAGNOSIS — Z8616 Personal history of COVID-19: Secondary | ICD-10-CM

## 2020-12-05 DIAGNOSIS — Z23 Encounter for immunization: Secondary | ICD-10-CM | POA: Diagnosis not present

## 2020-12-05 DIAGNOSIS — O365930 Maternal care for other known or suspected poor fetal growth, third trimester, not applicable or unspecified: Secondary | ICD-10-CM | POA: Insufficient documentation

## 2020-12-05 DIAGNOSIS — O99013 Anemia complicating pregnancy, third trimester: Secondary | ICD-10-CM

## 2020-12-05 DIAGNOSIS — O99213 Obesity complicating pregnancy, third trimester: Secondary | ICD-10-CM | POA: Diagnosis not present

## 2020-12-05 DIAGNOSIS — O358XX0 Maternal care for other (suspected) fetal abnormality and damage, not applicable or unspecified: Secondary | ICD-10-CM

## 2020-12-05 DIAGNOSIS — D649 Anemia, unspecified: Secondary | ICD-10-CM

## 2020-12-05 DIAGNOSIS — G43909 Migraine, unspecified, not intractable, without status migrainosus: Secondary | ICD-10-CM

## 2020-12-05 DIAGNOSIS — O2693 Pregnancy related conditions, unspecified, third trimester: Secondary | ICD-10-CM

## 2020-12-05 MED ORDER — BENZONATATE 200 MG PO CAPS: 200.0000 mg | ORAL_CAPSULE | Freq: Three times a day (TID) | ORAL | 0 refills | Status: DC | PRN

## 2020-12-05 NOTE — Progress Notes (Signed)
   PRENATAL VISIT NOTE  Subjective:  Rebecca Woodward is a 19 y.o. G2P1001 at [redacted]w[redacted]d being seen today for ongoing prenatal care.  She is currently monitored for the following issues for this low-risk pregnancy and has Attention deficit hyperactivity disorder (ADHD); Epilepsy (HCC); Supervision of other normal pregnancy, antepartum; Obesity in pregnancy; Anemia in pregnancy; Abnormal fetal ultrasound; and Small for gestational age on their problem list.  Patient reports no complaints.  Contractions: Not present. Vag. Bleeding: None.  Movement: Present. Denies leaking of fluid.   The following portions of the patient's history were reviewed and updated as appropriate: allergies, current medications, past family history, past medical history, past social history, past surgical history and problem list.   Objective:   Vitals:   12/05/20 0827  BP: 118/65  Pulse: 63  Weight: 224 lb (101.6 kg)    Fetal Status: Fetal Heart Rate (bpm): 145   Movement: Present     General:  Alert, oriented and cooperative. Patient is in no acute distress.  Skin: Skin is warm and dry. No rash noted.   Cardiovascular: Normal heart rate noted  Respiratory: Normal respiratory effort, no problems with respiration noted  Abdomen: Soft, gravid, appropriate for gestational age.  Pain/Pressure: Absent     Pelvic: Cervical exam deferred        Extremities: Normal range of motion.  Edema: None  Mental Status: Normal mood and affect. Normal behavior. Normal judgment and thought content.   Assessment and Plan:  Pregnancy: G2P1001 at [redacted]w[redacted]d   1. Supervision of other normal pregnancy, antepartum  Covid in the third trimester with co-morbidities, continue weekly MFM visits  Desires BTL- will schedule with MD Awaiting MFM recommendations for delivery, will likely be 37 weeks.   2. Abnormal fetal ultrasound   3. Obesity in pregnancy  Continue BASA  TDAP today     Preterm labor symptoms and general obstetric  precautions including but not limited to vaginal bleeding, contractions, leaking of fluid and fetal movement were reviewed in detail with the patient. Please refer to After Visit Summary for other counseling recommendations.   Return in about 2 weeks (around 12/19/2020), or MD visit for Tubal consent and GBS. Likely schedule induction at this time..  Future Appointments  Date Time Provider Department Center  12/12/2020  8:15 AM [facility] NURSE [facility]  12/12/2020  8:30 AM [facility] US3 [facility]  12/19/2020  9:00 AM [facility] NURSE [facility]  12/19/2020  9:15 AM [facility] US2 [facility]  12/20/2020  8:45 AM Conan Bowens, MD [facility]    Venia Carbon, NP

## 2020-12-05 NOTE — Patient Instructions (Addendum)
Laparoscopic Tubal Ligation Laparoscopic tubal ligation is a procedure to close the fallopian tubes. This is done to prevent pregnancy. When the fallopian tubes are closed, the eggs that your ovaries release cannot enter the uterus, and sperm cannot reach the released eggs. You should not have this procedure if you want to get pregnant someday or if you are unsure about having more children. Tell a health care provider about:  Any allergies you have.  All medicines you are taking, including vitamins, herbs, eye drops, creams, and over-the-counter medicines.  Any problems you or family members have had with anesthetic medicines.  Any blood disorders you have.  Any surgeries you have had.  Any medical conditions you have.  Whether you are pregnant or may be pregnant.  Any past pregnancies. What are the risks? Generally, this is a safe procedure. However, problems may occur, including:  Infection.  Bleeding.  Injury to other organs in the abdomen.  Side effects from anesthetic medicines.  Failure of the procedure. This procedure can increase your risk of an ectopic pregnancy. This is a pregnancy in which a fertilized egg attaches to the outside of the uterus. What happens before the procedure? Staying hydrated Follow instructions from your health care provider about hydration, which may include:  Up to 2 hours before the procedure - you may continue to drink clear liquids, such as water, clear fruit juice, black coffee, and plain tea. Eating and drinking restrictions Follow instructions from your health care provider about eating and drinking, which may include:  8 hours before the procedure - stop eating heavy meals or foods, such as meat, fried foods, or fatty foods.  6 hours before the procedure - stop eating light meals or foods, such as toast or cereal.  6 hours before the procedure - stop drinking milk or drinks that contain milk.  2 hours before the procedure - stop  drinking clear liquids. Medicines Ask your health care provider about:  Changing or stopping your regular medicines. This is especially important if you are taking diabetes medicines or blood thinners.  Taking medicines such as aspirin and ibuprofen. These medicines can thin your blood. Do not take these medicines unless your health care provider tells you to take them.  Taking over-the-counter medicines, vitamins, herbs, and supplements. Surgery safety Ask your health care provider:  How your surgery site will be marked.  What steps will be taken to help prevent infection. These steps may include: ? Removing hair at the surgery site. ? Washing skin with a germ-killing soap. ? Taking antibiotic medicine. General instructions  Do not use any products that contain nicotine or tobacco for at least 4 weeks before the procedure. These products include cigarettes, chewing tobacco, and vaping devices, such as e-cigarettes. If you need help quitting, ask your health care provider.  Plan to have someone take you home from the hospital.  If you will be going home right after the procedure, plan to have a responsible adult care for you for the time you are told. This is important. What happens during the procedure?  An IV will be inserted into one of your veins.  You will be given one or more of the following: ? A medicine to help you relax (sedative). ? A medicine to numb the area (local anesthetic). ? A medicine to make you fall asleep (general anesthetic). ? A medicine that is injected into an area of your body to numb everything below the injection site (regional anesthetic).  Your bladder   may be emptied with a small tube (catheter).  If you have been given a general anesthetic, a tube will be put down your throat to help you breathe.  Two small incisions will be made in your lower abdomen and near your belly button.  Your abdomen will be inflated with a gas. This will let the  surgeon see better and will give the surgeon room to work.  A lighted tube with camera (laparoscope) will be inserted into your abdomen through one of the incisions. Small instruments will be inserted through the other incision.  The fallopian tubes will be tied off, burned (cauterized), or blocked with a clip, ring, or clamp. A small portion in the center of each fallopian tube may be removed.  The gas will be released from the abdomen.  The incisions will be closed with stitches (sutures).  A bandage (dressing) will be placed over the incisions. The procedure may vary among health care providers and hospitals.      What happens after the procedure?  Your blood pressure, heart rate, breathing rate, and blood oxygen level will be monitored until you leave the hospital.  You will be given medicine to help with pain, nausea, and vomiting as needed.  You may have vaginal discharge after the procedure. You may need to wear a sanitary napkin.  If you were given a sedative during the procedure, it can affect you for several hours. Do not drive or operate machinery until your health care provider says that it is safe. Summary  Laparoscopic tubal ligation is a procedure that is done to prevent pregnancy.  You should not have this procedure if you want to get pregnant someday or if you are unsure about having more children.  The procedure is done using a thin, lighted tube (laparoscope) with a camera attached that will be inserted into your abdomen through an incision.  After the procedure you will be given medicine to help with pain, nausea, and vomiting as needed.  Plan to have someone take you home from the hospital. This information is not intended to replace advice given to you by your health care provider. Make sure you discuss any questions you have with your health care provider. Document Revised: 06/29/2020 Document Reviewed: 06/29/2020 Elsevier Patient Education  2021 Elsevier  Inc. Tdap (Tetanus, Diphtheria, Pertussis) Vaccine: What You Need to Know 1. Why get vaccinated? Tdap vaccine can prevent tetanus, diphtheria, and pertussis. Diphtheria and pertussis spread from person to person. Tetanus enters the body through cuts or wounds.  TETANUS (T) causes painful stiffening of the muscles. Tetanus can lead to serious health problems, including being unable to open the mouth, having trouble swallowing and breathing, or death.  DIPHTHERIA (D) can lead to difficulty breathing, heart failure, paralysis, or death.  PERTUSSIS (aP), also known as "whooping cough," can cause uncontrollable, violent coughing that makes it hard to breathe, eat, or drink. Pertussis can be extremely serious especially in babies and young children, causing pneumonia, convulsions, brain damage, or death. In teens and adults, it can cause weight loss, loss of bladder control, passing out, and rib fractures from severe coughing. 2. Tdap vaccine Tdap is only for children 7 years and older, adolescents, and adults.  Adolescents should receive a single dose of Tdap, preferably at age 67 or 12 years. Pregnant people should get a dose of Tdap during every pregnancy, preferably during the early part of the third trimester, to help protect the newborn from pertussis. Infants are most at risk  for severe, life-threatening complications from pertussis. Adults who have never received Tdap should get a dose of Tdap. Also, adults should receive a booster dose of either Tdap or Td (a different vaccine that protects against tetanus and diphtheria but not pertussis) every 10 years, or after 5 years in the case of a severe or dirty wound or burn. Tdap may be given at the same time as other vaccines. 3. Talk with your health care provider Tell your vaccine provider if the person getting the vaccine:  Has had an allergic reaction after a previous dose of any vaccine that protects against tetanus, diphtheria, or pertussis,  or has any severe, life-threatening allergies  Has had a coma, decreased level of consciousness, or prolonged seizures within 7 days after a previous dose of any pertussis vaccine (DTP, DTaP, or Tdap)  Has seizures or another nervous system problem  Has ever had Guillain-Barr Syndrome (also called "GBS")  Has had severe pain or swelling after a previous dose of any vaccine that protects against tetanus or diphtheria In some cases, your health care provider may decide to postpone Tdap vaccination until a future visit. People with minor illnesses, such as a cold, may be vaccinated. People who are moderately or severely ill should usually wait until they recover before getting Tdap vaccine.  Your health care provider can give you more information. 4. Risks of a vaccine reaction  Pain, redness, or swelling where the shot was given, mild fever, headache, feeling tired, and nausea, vomiting, diarrhea, or stomachache sometimes happen after Tdap vaccination. People sometimes faint after medical procedures, including vaccination. Tell your provider if you feel dizzy or have vision changes or ringing in the ears.  As with any medicine, there is a very remote chance of a vaccine causing a severe allergic reaction, other serious injury, or death. 5. What if there is a serious problem? An allergic reaction could occur after the vaccinated person leaves the clinic. If you see signs of a severe allergic reaction (hives, swelling of the face and throat, difficulty breathing, a fast heartbeat, dizziness, or weakness), call 9-1-1 and get the person to the nearest hospital. For other signs that concern you, call your health care provider.  Adverse reactions should be reported to the Vaccine Adverse Event Reporting System (VAERS). Your health care provider will usually file this report, or you can do it yourself. Visit the VAERS website at www.vaers.LAgents.no or call 254-492-3328. VAERS is only for reporting  reactions, and VAERS staff members do not give medical advice. 6. The National Vaccine Injury Compensation Program The [employer] Vaccine Injury Compensation Program (VICP) is a federal program that was created to compensate people who may have been injured by certain vaccines. Claims regarding alleged injury or death due to vaccination have a time limit for filing, which may be as short as two years. Visit the VICP website at SpiritualWord.at or call (540)108-0913 to learn about the program and about filing a claim. 7. How can I learn more?  Ask your health care provider.  Call your local or state health department.  Visit the website of the Food and Drug Administration (FDA) for vaccine package inserts and additional information at FinderList.no.  Contact the Centers for Disease Control and Prevention (CDC): ? Call 813 345 7748 (1-800-CDC-INFO) or ? Visit CDC's website at PicCapture.uy. Vaccine Information Statement Tdap (Tetanus, Diphtheria, Pertussis) Vaccine (06/01/2020) This information is not intended to replace advice given to you by your health care provider. Make sure you discuss any questions you  have with your health care provider. Document Revised: 06/27/2020 Document Reviewed: 06/27/2020 Elsevier Patient Education  2021 [employer].

## 2020-12-12 ENCOUNTER — Ambulatory Visit: Payer: Medicaid Other

## 2020-12-19 ENCOUNTER — Encounter: Payer: Self-pay | Admitting: *Deleted

## 2020-12-19 ENCOUNTER — Ambulatory Visit: Payer: Medicaid Other | Admitting: *Deleted

## 2020-12-19 ENCOUNTER — Ambulatory Visit: Payer: Medicaid Other | Attending: Obstetrics

## 2020-12-19 ENCOUNTER — Other Ambulatory Visit: Payer: Self-pay

## 2020-12-19 DIAGNOSIS — O358XX0 Maternal care for other (suspected) fetal abnormality and damage, not applicable or unspecified: Secondary | ICD-10-CM

## 2020-12-19 DIAGNOSIS — O365930 Maternal care for other known or suspected poor fetal growth, third trimester, not applicable or unspecified: Secondary | ICD-10-CM | POA: Insufficient documentation

## 2020-12-19 DIAGNOSIS — O99210 Obesity complicating pregnancy, unspecified trimester: Secondary | ICD-10-CM

## 2020-12-19 DIAGNOSIS — O99213 Obesity complicating pregnancy, third trimester: Secondary | ICD-10-CM

## 2020-12-19 DIAGNOSIS — O99353 Diseases of the nervous system complicating pregnancy, third trimester: Secondary | ICD-10-CM

## 2020-12-19 DIAGNOSIS — D649 Anemia, unspecified: Secondary | ICD-10-CM

## 2020-12-19 DIAGNOSIS — Z3480 Encounter for supervision of other normal pregnancy, unspecified trimester: Secondary | ICD-10-CM

## 2020-12-19 DIAGNOSIS — Z8616 Personal history of COVID-19: Secondary | ICD-10-CM

## 2020-12-19 DIAGNOSIS — O283 Abnormal ultrasonic finding on antenatal screening of mother: Secondary | ICD-10-CM | POA: Diagnosis present

## 2020-12-19 DIAGNOSIS — Z3A33 33 weeks gestation of pregnancy: Secondary | ICD-10-CM

## 2020-12-19 DIAGNOSIS — G40909 Epilepsy, unspecified, not intractable, without status epilepticus: Secondary | ICD-10-CM

## 2020-12-19 DIAGNOSIS — O2693 Pregnancy related conditions, unspecified, third trimester: Secondary | ICD-10-CM

## 2020-12-19 DIAGNOSIS — O99013 Anemia complicating pregnancy, third trimester: Secondary | ICD-10-CM

## 2020-12-19 DIAGNOSIS — E669 Obesity, unspecified: Secondary | ICD-10-CM

## 2020-12-20 ENCOUNTER — Ambulatory Visit (INDEPENDENT_AMBULATORY_CARE_PROVIDER_SITE_OTHER): Payer: Medicaid Other | Admitting: Obstetrics and Gynecology

## 2020-12-20 ENCOUNTER — Other Ambulatory Visit (HOSPITAL_COMMUNITY)
Admission: RE | Admit: 2020-12-20 | Discharge: 2020-12-20 | Disposition: A | Discharge status: Home / Self Care | Payer: Medicaid Other | Source: Ambulatory Visit | Attending: Obstetrics and Gynecology | Admitting: Obstetrics and Gynecology

## 2020-12-20 ENCOUNTER — Encounter: Payer: Self-pay | Admitting: Obstetrics and Gynecology

## 2020-12-20 VITALS — BP 117/70 | HR 81 | Wt 227.0 lb

## 2020-12-20 DIAGNOSIS — O99210 Obesity complicating pregnancy, unspecified trimester: Secondary | ICD-10-CM

## 2020-12-20 DIAGNOSIS — O283 Abnormal ultrasonic finding on antenatal screening of mother: Secondary | ICD-10-CM

## 2020-12-20 DIAGNOSIS — O99013 Anemia complicating pregnancy, third trimester: Secondary | ICD-10-CM

## 2020-12-20 DIAGNOSIS — Z3480 Encounter for supervision of other normal pregnancy, unspecified trimester: Secondary | ICD-10-CM | POA: Insufficient documentation

## 2020-12-20 DIAGNOSIS — Z3009 Encounter for other general counseling and advice on contraception: Secondary | ICD-10-CM | POA: Insufficient documentation

## 2020-12-20 DIAGNOSIS — Z3A35 35 weeks gestation of pregnancy: Secondary | ICD-10-CM

## 2020-12-20 LAB — OB RESULTS CONSOLE GBS: GBS: NEGATIVE

## 2020-12-20 NOTE — Progress Notes (Signed)
   PRENATAL VISIT NOTE  Subjective:  Rebecca Woodward is a 20 y.o. G2P1001 at [redacted]w[redacted]d being seen today for ongoing prenatal care.  She is currently monitored for the following issues for this high-risk pregnancy and has Attention deficit hyperactivity disorder (ADHD); Epilepsy (HCC); Supervision of other normal pregnancy, antepartum; Obesity in pregnancy; Anemia in pregnancy; Abnormal fetal ultrasound; Small for gestational age; and Unwanted fertility on their problem list.  Patient reports occasional contractions.  Contractions: Not present. Vag. Bleeding: None.  Movement: Present. Denies leaking of fluid.   The following portions of the patient's history were reviewed and updated as appropriate: allergies, current medications, past family history, past medical history, past social history, past surgical history and problem list.   Objective:   Vitals:   12/20/20 0851  BP: 117/70  Pulse: 81  Weight: 227 lb (103 kg)    Fetal Status: Fetal Heart Rate (bpm): 143   Movement: Present     General:  Alert, oriented and cooperative. Patient is in no acute distress.  Skin: Skin is warm and dry. No rash noted.   Cardiovascular: Normal heart rate noted  Respiratory: Normal respiratory effort, no problems with respiration noted  Abdomen: Soft, gravid, appropriate for gestational age.  Pain/Pressure: Absent     Pelvic: Cervical exam deferred        Extremities: Normal range of motion.  Edema: Trace  Mental Status: Normal mood and affect. Normal behavior. Normal judgment and thought content.   Assessment and Plan:  Pregnancy: G2P1001 at [redacted]w[redacted]d 1. Supervision of other normal pregnancy, antepartum - Culture, beta strep (group b only) - Cervicovaginal ancillary only( [place])  2. Abnormal fetal ultrasound EIF  3. Small for gestational age IUGR that has resolved, normal dopplers as of yesterday Needs delivery @ 39 weeks per MFM  4. Obesity in pregnancy  5. Anemia during pregnancy in  third trimester  6. Unwanted fertility - signed BTL papers - undecided - reviewed this is a permanent procedure and she will not be able to have children after it is done, there is no reliable reversal. Reviewed risks of bilateral tubal ligation including infection, hemorrhage, damage to surrounding tissue and organs, risk of regret, particularly at a young age. Reviewed that bilateral tubal ligation is not 100% effective.   7. [redacted] weeks gestation of pregnancy   Preterm labor symptoms and general obstetric precautions including but not limited to vaginal bleeding, contractions, leaking of fluid and fetal movement were reviewed in detail with the patient. Please refer to After Visit Summary for other counseling recommendations.   Return in about 1 week (around 12/27/2020) for in person, high OB.  Future Appointments  Date Time Provider Department Center  12/28/2020 11:10 AM Rasch, Harolyn Rutherford, NP [facility]    Conan Bowens, MD

## 2020-12-21 LAB — CERVICOVAGINAL ANCILLARY ONLY
Chlamydia: NEGATIVE
Comment: NEGATIVE
Comment: NORMAL
Neisseria Gonorrhea: NEGATIVE

## 2020-12-23 LAB — CULTURE, BETA STREP (GROUP B ONLY)
MICRO NUMBER:: 11578040
SPECIMEN QUALITY:: ADEQUATE

## 2020-12-27 ENCOUNTER — Ambulatory Visit (INDEPENDENT_AMBULATORY_CARE_PROVIDER_SITE_OTHER): Payer: Medicaid Other | Admitting: Obstetrics and Gynecology

## 2020-12-27 ENCOUNTER — Other Ambulatory Visit: Payer: Self-pay

## 2020-12-27 ENCOUNTER — Encounter: Payer: Self-pay | Admitting: Obstetrics and Gynecology

## 2020-12-27 VITALS — BP 111/73 | HR 77 | Wt 227.0 lb

## 2020-12-27 DIAGNOSIS — O283 Abnormal ultrasonic finding on antenatal screening of mother: Secondary | ICD-10-CM

## 2020-12-27 DIAGNOSIS — Z3A36 36 weeks gestation of pregnancy: Secondary | ICD-10-CM

## 2020-12-27 DIAGNOSIS — Z3480 Encounter for supervision of other normal pregnancy, unspecified trimester: Secondary | ICD-10-CM

## 2020-12-27 DIAGNOSIS — Z3009 Encounter for other general counseling and advice on contraception: Secondary | ICD-10-CM

## 2020-12-27 DIAGNOSIS — O99210 Obesity complicating pregnancy, unspecified trimester: Secondary | ICD-10-CM

## 2020-12-27 DIAGNOSIS — O99013 Anemia complicating pregnancy, third trimester: Secondary | ICD-10-CM

## 2020-12-27 NOTE — Addendum Note (Signed)
Addended by: Leroy Libman on: 12/27/2020 08:54 AM   Modules accepted: Orders, SmartSet

## 2020-12-27 NOTE — Progress Notes (Addendum)
   PRENATAL VISIT NOTE  Subjective:  Rebecca Woodward is a 20 y.o. G2P1001 at [redacted]w[redacted]d being seen today for ongoing prenatal care.  She is currently monitored for the following issues for this high-risk pregnancy and has Attention deficit hyperactivity disorder (ADHD); Epilepsy (HCC); Supervision of other normal pregnancy, antepartum; Obesity in pregnancy; Anemia in pregnancy; Abnormal fetal ultrasound; Small for gestational age; and Unwanted fertility on their problem list.  Patient reports no complaints.  Contractions: Not present. Vag. Bleeding: None.  Movement: Present. Denies leaking of fluid.   The following portions of the patient's history were reviewed and updated as appropriate: allergies, current medications, past family history, past medical history, past social history, past surgical history and problem list.   Objective:   Vitals:   12/27/20 0835  BP: 111/73  Pulse: 77  Weight: 227 lb (103 kg)    Fetal Status: Fetal Heart Rate (bpm): 131   Movement: Present     General:  Alert, oriented and cooperative. Patient is in no acute distress.  Skin: Skin is warm and dry. No rash noted.   Cardiovascular: Normal heart rate noted  Respiratory: Normal respiratory effort, no problems with respiration noted  Abdomen: Soft, gravid, appropriate for gestational age.  Pain/Pressure: Absent     Pelvic: Cervical exam deferred        Extremities: Normal range of motion.  Edema: Trace  Mental Status: Normal mood and affect. Normal behavior. Normal judgment and thought content.   Assessment and Plan:  Pregnancy: G2P1001 at [redacted]w[redacted]d 1. Supervision of other normal pregnancy, antepartum Doing well  2. Obesity in pregnancy  3. Anemia during pregnancy in third trimester  4. Abnormal fetal ultrasound  5. Small for gestational age Resolved as of 12/20/20 [place] IOL sched for 39 weeks  6. Unwanted fertility Declines now Would like pills  7. [redacted] weeks gestation of pregnancy   Preterm labor  symptoms and general obstetric precautions including but not limited to vaginal bleeding, contractions, leaking of fluid and fetal movement were reviewed in detail with the patient. Please refer to After Visit Summary for other counseling recommendations.   Return in about 1 week (around 01/03/2021) for high OB, in person.  Future Appointments  Date Time Provider Department Center  01/11/2021  6:30 AM [facility] SCHED ROOM [facility] None    Conan Bowens, MD

## 2020-12-28 ENCOUNTER — Encounter: Payer: Medicaid Other | Admitting: Obstetrics and Gynecology

## 2020-12-31 ENCOUNTER — Telehealth (HOSPITAL_COMMUNITY): Payer: Self-pay | Admitting: *Deleted

## 2020-12-31 ENCOUNTER — Encounter (HOSPITAL_COMMUNITY): Payer: Self-pay | Admitting: *Deleted

## 2020-12-31 NOTE — Telephone Encounter (Signed)
Preadmission screen  

## 2021-01-02 ENCOUNTER — Other Ambulatory Visit: Payer: Self-pay | Admitting: Advanced Practice Midwife

## 2021-01-03 ENCOUNTER — Encounter: Payer: Self-pay | Admitting: Obstetrics and Gynecology

## 2021-01-03 ENCOUNTER — Other Ambulatory Visit: Payer: Self-pay

## 2021-01-03 ENCOUNTER — Ambulatory Visit (INDEPENDENT_AMBULATORY_CARE_PROVIDER_SITE_OTHER): Payer: Medicaid Other | Admitting: Obstetrics and Gynecology

## 2021-01-03 VITALS — BP 119/72 | HR 77 | Wt 227.0 lb

## 2021-01-03 DIAGNOSIS — Z3480 Encounter for supervision of other normal pregnancy, unspecified trimester: Secondary | ICD-10-CM

## 2021-01-03 DIAGNOSIS — O99210 Obesity complicating pregnancy, unspecified trimester: Secondary | ICD-10-CM

## 2021-01-03 DIAGNOSIS — O283 Abnormal ultrasonic finding on antenatal screening of mother: Secondary | ICD-10-CM

## 2021-01-03 DIAGNOSIS — O99013 Anemia complicating pregnancy, third trimester: Secondary | ICD-10-CM

## 2021-01-03 DIAGNOSIS — Z3009 Encounter for other general counseling and advice on contraception: Secondary | ICD-10-CM

## 2021-01-03 NOTE — Progress Notes (Signed)
   PRENATAL VISIT NOTE  Subjective:  Rebecca Woodward is a 20 y.o. G2P1001 at [redacted]w[redacted]d being seen today for ongoing prenatal care.  She is currently monitored for the following issues for this high-risk pregnancy and has Attention deficit hyperactivity disorder (ADHD); Epilepsy (HCC); Supervision of other normal pregnancy, antepartum; Obesity in pregnancy; Anemia in pregnancy; Abnormal fetal ultrasound; Small for gestational age; and Unwanted fertility on their problem list.  Patient reports no complaints.  Contractions: Not present. Vag. Bleeding: None.  Movement: Present. Denies leaking of fluid.   The following portions of the patient's history were reviewed and updated as appropriate: allergies, current medications, past family history, past medical history, past social history, past surgical history and problem list.   Objective:   Vitals:   01/03/21 0906  BP: 119/72  Pulse: 77  Weight: 227 lb (103 kg)    Fetal Status: Fetal Heart Rate (bpm): 147 Fundal Height: 38 cm Movement: Present  Presentation: Vertex  General:  Alert, oriented and cooperative. Patient is in no acute distress.  Skin: Skin is warm and dry. No rash noted.   Cardiovascular: Normal heart rate noted  Respiratory: Normal respiratory effort, no problems with respiration noted  Abdomen: Soft, gravid, appropriate for gestational age.  Pain/Pressure: Present     Pelvic: Cervical exam performed in the presence of a chaperone Dilation: 1 Effacement (%): Thick Station: -3  Extremities: Normal range of motion.  Edema: Trace  Mental Status: Normal mood and affect. Normal behavior. Normal judgment and thought content.   Assessment and Plan:  Pregnancy: G2P1001 at [redacted]w[redacted]d 1. Small for gestational age Patient scheduled for IOL on 3/18  2. Anemia during pregnancy in third trimester Continue iron supplement  3. Supervision of other normal pregnancy, antepartum Patient is doing well and is without complaints  4. Obesity in  pregnancy Continue ASA  5. Unwanted fertility Patient plans COC for contraception  Term labor symptoms and general obstetric precautions including but not limited to vaginal bleeding, contractions, leaking of fluid and fetal movement were reviewed in detail with the patient. Please refer to After Visit Summary for other counseling recommendations.   Return in about 6 weeks (around 02/14/2021) for postpartum.  Future Appointments  Date Time Provider Department Center  01/11/2021  6:30 AM [facility] SCHED ROOM [facility] None    Catalina Antigua, MD

## 2021-01-08 ENCOUNTER — Other Ambulatory Visit: Payer: Self-pay

## 2021-01-08 ENCOUNTER — Ambulatory Visit (INDEPENDENT_AMBULATORY_CARE_PROVIDER_SITE_OTHER): Payer: Medicaid Other | Admitting: Certified Nurse Midwife

## 2021-01-08 ENCOUNTER — Encounter: Payer: Self-pay | Admitting: Certified Nurse Midwife

## 2021-01-08 VITALS — BP 128/73 | HR 94 | Wt 229.0 lb

## 2021-01-08 DIAGNOSIS — Z3A38 38 weeks gestation of pregnancy: Secondary | ICD-10-CM

## 2021-01-08 DIAGNOSIS — O99019 Anemia complicating pregnancy, unspecified trimester: Secondary | ICD-10-CM

## 2021-01-08 DIAGNOSIS — Z3480 Encounter for supervision of other normal pregnancy, unspecified trimester: Secondary | ICD-10-CM

## 2021-01-08 DIAGNOSIS — O99210 Obesity complicating pregnancy, unspecified trimester: Secondary | ICD-10-CM

## 2021-01-08 NOTE — Progress Notes (Signed)
   PRENATAL VISIT NOTE  Subjective:  Rebecca Woodward is a 20 y.o. G2P1001 at [redacted]w[redacted]d being seen today for ongoing prenatal care.  She is currently monitored for the following issues for this high-risk pregnancy and has Attention deficit hyperactivity disorder (ADHD); Epilepsy (HCC); Supervision of other normal pregnancy, antepartum; Obesity in pregnancy; Anemia in pregnancy; Abnormal fetal ultrasound; Small for gestational age; and Unwanted fertility on their problem list.  Patient reports occasional contractions.  Contractions: Irregular. Vag. Bleeding: None.  Movement: Present. Denies leaking of fluid.   The following portions of the patient's history were reviewed and updated as appropriate: allergies, current medications, past family history, past medical history, past social history, past surgical history and problem list.   Objective:   Vitals:   01/08/21 1049  BP: 128/73  Pulse: 94  Weight: 229 lb (103.9 kg)    Fetal Status: Fetal Heart Rate (bpm): 142   Movement: Present  Presentation: Vertex  General:  Alert, oriented and cooperative. Patient is in no acute distress.  Skin: Skin is warm and dry. No rash noted.   Cardiovascular: Normal heart rate noted  Respiratory: Normal respiratory effort, no problems with respiration noted  Abdomen: Soft, gravid, appropriate for gestational age.  Pain/Pressure: Present     Pelvic: Cervical exam performed in the presence of a chaperone Dilation: 1 Effacement (%): Thick Station: Ballotable  Extremities: Normal range of motion.  Edema: Trace  Mental Status: Normal mood and affect. Normal behavior. Normal judgment and thought content.   Assessment and Plan:  Pregnancy: G2P1001 at [redacted]w[redacted]d 1. Supervision of other normal pregnancy, antepartum - Patient doing well, reports occasional contractions and pelvic pressure  - Patient has IOL scheduled for 3/18, educated and discussed process of IOL with cytotec, FB and pitocin  - Labor precautions and  reasons to present to MAU discussed  - discussed with patient that labor will call patient on 3/18 when room is ready   2. Antepartum anemia - continue iron supplementation   3. [redacted] weeks gestation of pregnancy  4. Obesity in pregnancy - continue bASA   Term labor symptoms and general obstetric precautions including but not limited to vaginal bleeding, contractions, leaking of fluid and fetal movement were reviewed in detail with the patient. Please refer to After Visit Summary for other counseling recommendations.    Future Appointments  Date Time Provider Department Center  01/11/2021  6:30 AM [facility] SCHED ROOM [facility] None  02/15/2021  8:30 AM Donette Larry, CNM [facility]    Sharyon Cable, CNM

## 2021-01-08 NOTE — Patient Instructions (Signed)
New Induction of Labor Process for [employer] [facility]  In Fall 2020 [facility] changed it's process for scheduling inductions of labor to create more induction slots and to make sure patients get COVID-19 testing in advance. After you have been tested you need to quarantine so that you do not get infected after your test. You should not go anywhere after your test except necessary medical appointments.  You have been scheduled for induction of labor on 3/18. Although you may have a specific time listed on your After Visit Summary or MyChart, we cannot predict when your room will be available. Please disregard this time. A Labor and Delivery staff member will call you on the day that you are scheduled when your room is available. You will need to arrive within one hour of being called. If you do not arrive within this time frame, the next person on the list will be called in and you will move down the list. You may eat a light meal before coming to the hospital. If you go into labor, think your water has broken, experience bright red bleeding or don't feel your baby moving as much as usual before your induction, please call your Ob/Gyn's office or come to Entrance C, Maternity Assessment Unit for evaluation.  Thank you,  [facility] [employer]

## 2021-01-09 ENCOUNTER — Other Ambulatory Visit (HOSPITAL_COMMUNITY): Payer: Medicaid Other

## 2021-01-11 ENCOUNTER — Inpatient Hospital Stay (HOSPITAL_COMMUNITY): Payer: Medicaid Other | Admitting: Anesthesiology

## 2021-01-11 ENCOUNTER — Encounter (HOSPITAL_COMMUNITY): Payer: Self-pay | Admitting: Obstetrics and Gynecology

## 2021-01-11 ENCOUNTER — Inpatient Hospital Stay (HOSPITAL_COMMUNITY): Payer: Medicaid Other

## 2021-01-11 ENCOUNTER — Other Ambulatory Visit: Payer: Self-pay

## 2021-01-11 ENCOUNTER — Inpatient Hospital Stay (HOSPITAL_COMMUNITY)
Admission: AD | Admit: 2021-01-11 | Discharge: 2021-01-13 | DRG: 807 | Disposition: A | Discharge status: Home / Self Care | Payer: Medicaid Other | Attending: Family Medicine | Admitting: Family Medicine

## 2021-01-11 DIAGNOSIS — O365990 Maternal care for other known or suspected poor fetal growth, unspecified trimester, not applicable or unspecified: Secondary | ICD-10-CM | POA: Diagnosis present

## 2021-01-11 DIAGNOSIS — O99214 Obesity complicating childbirth: Secondary | ICD-10-CM | POA: Diagnosis present

## 2021-01-11 DIAGNOSIS — Z8616 Personal history of COVID-19: Secondary | ICD-10-CM | POA: Diagnosis not present

## 2021-01-11 DIAGNOSIS — Z3A39 39 weeks gestation of pregnancy: Secondary | ICD-10-CM | POA: Diagnosis not present

## 2021-01-11 DIAGNOSIS — O9902 Anemia complicating childbirth: Secondary | ICD-10-CM | POA: Diagnosis present

## 2021-01-11 DIAGNOSIS — O365930 Maternal care for other known or suspected poor fetal growth, third trimester, not applicable or unspecified: Secondary | ICD-10-CM | POA: Diagnosis present

## 2021-01-11 LAB — CBC
HCT: 31.1 % — ABNORMAL LOW (ref 36.0–46.0)
Hemoglobin: 9.9 g/dL — ABNORMAL LOW (ref 12.0–15.0)
MCH: 26.1 pg (ref 26.0–34.0)
MCHC: 31.8 g/dL (ref 30.0–36.0)
MCV: 82.1 fL (ref 80.0–100.0)
Platelets: 359 10*3/uL (ref 150–400)
RBC: 3.79 MIL/uL — ABNORMAL LOW (ref 3.87–5.11)
RDW: 16.3 % — ABNORMAL HIGH (ref 11.5–15.5)
WBC: 10.4 10*3/uL (ref 4.0–10.5)
nRBC: 0 % (ref 0.0–0.2)

## 2021-01-11 LAB — TYPE AND SCREEN
ABO/RH(D): A POS
Antibody Screen: NEGATIVE

## 2021-01-11 MED ORDER — LACTATED RINGERS IV SOLN: INTRAVENOUS | Status: DC

## 2021-01-11 MED ORDER — DIPHENHYDRAMINE HCL 50 MG/ML IJ SOLN: 12.5000 mg | INTRAMUSCULAR | Status: DC | PRN

## 2021-01-11 MED ORDER — LACTATED RINGERS IV SOLN
500.0000 mL | INTRAVENOUS | Status: DC | PRN
  Administered 2021-01-12: 500 mL via INTRAVENOUS

## 2021-01-11 MED ORDER — PHENYLEPHRINE 40 MCG/ML (10ML) SYRINGE FOR IV PUSH (FOR BLOOD PRESSURE SUPPORT)
80.0000 ug | PREFILLED_SYRINGE | INTRAVENOUS | Status: DC | PRN
  Filled 2021-01-11: qty 10

## 2021-01-11 MED ORDER — OXYTOCIN-SODIUM CHLORIDE 30-0.9 UT/500ML-% IV SOLN
2.5000 [IU]/h | INTRAVENOUS | Status: DC
  Filled 2021-01-11: qty 500

## 2021-01-11 MED ORDER — LIDOCAINE-EPINEPHRINE (PF) 2 %-1:200000 IJ SOLN
INTRAMUSCULAR | Status: DC | PRN
  Administered 2021-01-11: 4 mL via EPIDURAL

## 2021-01-11 MED ORDER — FENTANYL CITRATE (PF) 100 MCG/2ML IJ SOLN
100.0000 ug | INTRAMUSCULAR | Status: DC | PRN
  Administered 2021-01-11 (×2): 100 ug via INTRAVENOUS
  Filled 2021-01-11 (×3): qty 2

## 2021-01-11 MED ORDER — LIDOCAINE HCL (PF) 1 % IJ SOLN: 30.0000 mL | INTRAMUSCULAR | Status: DC | PRN

## 2021-01-11 MED ORDER — OXYCODONE-ACETAMINOPHEN 5-325 MG PO TABS
1.0000 | ORAL_TABLET | ORAL | Status: DC | PRN
Start: 2021-01-11 — End: 2021-01-12

## 2021-01-11 MED ORDER — ACETAMINOPHEN 325 MG PO TABS: 650.0000 mg | ORAL_TABLET | ORAL | Status: DC | PRN

## 2021-01-11 MED ORDER — EPHEDRINE 5 MG/ML INJ: 10.0000 mg | INTRAVENOUS | Status: DC | PRN

## 2021-01-11 MED ORDER — TERBUTALINE SULFATE 1 MG/ML IJ SOLN: 0.2500 mg | Freq: Once | INTRAMUSCULAR | Status: DC | PRN

## 2021-01-11 MED ORDER — MISOPROSTOL 50MCG HALF TABLET
50.0000 ug | ORAL_TABLET | ORAL | Status: DC
  Administered 2021-01-11: 50 ug via ORAL
  Filled 2021-01-11: qty 1

## 2021-01-11 MED ORDER — SOD CITRATE-CITRIC ACID 500-334 MG/5ML PO SOLN: 30.0000 mL | ORAL | Status: DC | PRN

## 2021-01-11 MED ORDER — PHENYLEPHRINE 40 MCG/ML (10ML) SYRINGE FOR IV PUSH (FOR BLOOD PRESSURE SUPPORT): 80.0000 ug | PREFILLED_SYRINGE | INTRAVENOUS | Status: DC | PRN

## 2021-01-11 MED ORDER — FENTANYL-BUPIVACAINE-NACL 0.5-0.125-0.9 MG/250ML-% EP SOLN
12.0000 mL/h | EPIDURAL | Status: DC | PRN
  Administered 2021-01-11: 12 mL/h via EPIDURAL
  Filled 2021-01-11: qty 250

## 2021-01-11 MED ORDER — ONDANSETRON HCL 4 MG/2ML IJ SOLN: 4.0000 mg | Freq: Four times a day (QID) | INTRAMUSCULAR | Status: DC | PRN

## 2021-01-11 MED ORDER — OXYCODONE-ACETAMINOPHEN 5-325 MG PO TABS
2.0000 | ORAL_TABLET | ORAL | Status: DC | PRN
Start: 2021-01-11 — End: 2021-01-12

## 2021-01-11 MED ORDER — LACTATED RINGERS IV SOLN
500.0000 mL | Freq: Once | INTRAVENOUS | Status: AC
  Administered 2021-01-11: 500 mL via INTRAVENOUS

## 2021-01-11 MED ORDER — OXYTOCIN BOLUS FROM INFUSION
333.0000 mL | Freq: Once | INTRAVENOUS | Status: AC
  Administered 2021-01-12: 333 mL via INTRAVENOUS

## 2021-01-11 NOTE — Progress Notes (Signed)
LABOR PROGRESS NOTE  Rebecca Woodward is a 20 y.o. G2P1001 at [redacted]w[redacted]d  admitted for IOL for FGR  Subjective: Patient doing well, just received epidural at this time   Objective: BP (!) 112/55   Pulse 69   Temp 98 F (36.7 C) (Oral)   Resp 16   Ht 5' 4.5" (1.638 m)   Wt 103.9 kg   LMP 04/13/2020   SpO2 100%   BMI 38.70 kg/m  or  Vitals:   01/11/21 2026 01/11/21 2031 01/11/21 2036 01/11/21 2042  BP: 132/76 136/80 135/76 (!) 112/55  Pulse: 77 78 76 69  Resp:   15 16  Temp:      TempSrc:      SpO2: 100% 100% 100%   Weight:      Height:        RN plans to recheck after patient comfortable with epidural and foley placement  Dilation: 5 Effacement (%): 80 Station: -2 Presentation: Vertex Exam by:: Fabian November, CNM FHT: baseline rate 125, moderate varibility, +accel, variable decel Toco: readjusting  Labs: Lab Results  Component Value Date   WBC 10.4 01/11/2021   HGB 9.9 (L) 01/11/2021   HCT 31.1 (L) 01/11/2021   MCV 82.1 01/11/2021   PLT 359 01/11/2021    Patient Active Problem List   Diagnosis Date Noted  . Pregnancy affected by fetal growth restriction 01/11/2021  . Unwanted fertility 12/20/2020  . Small for gestational age 37/26/2022  . Abnormal fetal ultrasound 09/07/2020  . Anemia in pregnancy 06/25/2020  . Supervision of other normal pregnancy, antepartum 06/22/2020  . Obesity in pregnancy 06/22/2020  . Attention deficit hyperactivity disorder (ADHD) 07/24/2009  . Epilepsy (HCC) 07/24/2009    Assessment / Plan: 20 y.o. G2P1001 at [redacted]w[redacted]d here for IOL for FGR  Labor: Plan to recheck cervix after foley catheter insertion  Fetal Wellbeing:  Cat II  Pain Control:  Epidural  Anticipated MOD:  SVD  Sharyon Cable, CNM 01/11/2021, 9:07 PM

## 2021-01-11 NOTE — Progress Notes (Signed)
FB out SVE by RN 4.5/80/-1>? fetal position Vtx confirmed by [facility]ges[facility] prn

## 2021-01-11 NOTE — Anesthesia Procedure Notes (Signed)
Epidural Patient location during procedure: OB Start time: 01/11/2021 8:15 PM End time: 01/11/2021 8:25 PM  Staffing Anesthesiologist: Elmer Picker, MD Performed: anesthesiologist   Preanesthetic Checklist Completed: patient identified, IV checked, risks and benefits discussed, monitors and equipment checked, pre-op evaluation and timeout performed  Epidural Patient position: sitting Prep: DuraPrep and site prepped and draped Patient monitoring: continuous pulse ox, blood pressure, heart rate and cardiac monitor Approach: midline Location: L3-L4 Injection technique: LOR air  Needle:  Needle type: Tuohy  Needle gauge: 17 G Needle length: 9 cm Needle insertion depth: 7 cm Catheter type: closed end flexible Catheter size: 19 Gauge Catheter at skin depth: 12 cm Test dose: negative  Assessment Sensory level: T8 Events: blood not aspirated, injection not painful, no injection resistance, no paresthesia and negative IV test  Additional Notes Patient identified. Risks/Benefits/Options discussed with patient including but not limited to bleeding, infection, nerve damage, paralysis, failed block, incomplete pain control, headache, blood pressure changes, nausea, vomiting, reactions to medication both or allergic, itching and postpartum back pain. Confirmed with bedside nurse the patient's most recent platelet count. Confirmed with patient that they are not currently taking any anticoagulation, have any bleeding history or any family history of bleeding disorders. Patient expressed understanding and wished to proceed. All questions were answered. Sterile technique was used throughout the entire procedure. Please see nursing notes for vital signs. Test dose was given through epidural catheter and negative prior to continuing to dose epidural or start infusion. Warning signs of high block given to the patient including shortness of breath, tingling/numbness in hands, complete motor block,  or any concerning symptoms with instructions to call for help. Patient was given instructions on fall risk and not to get out of bed. All questions and concerns addressed with instructions to call with any issues or inadequate analgesia.  Reason for block:procedure for pain

## 2021-01-11 NOTE — Anesthesia Preprocedure Evaluation (Signed)
Anesthesia Evaluation  Patient identified by MRN, date of birth, ID band Patient awake    Reviewed: Allergy & Precautions, NPO status , Patient's Chart, lab work & pertinent test results  Airway Mallampati: III  TM Distance: >3 FB Neck ROM: Full    Dental no notable dental hx.    Pulmonary neg pulmonary ROS,    Pulmonary exam normal breath sounds clear to auscultation       Cardiovascular negative cardio ROS Normal cardiovascular exam Rhythm:Regular Rate:Normal     Neuro/Psych negative neurological ROS  negative psych ROS   GI/Hepatic negative GI ROS, Neg liver ROS,   Endo/Other  negative endocrine ROS  Renal/GU negative Renal ROS  negative genitourinary   Musculoskeletal negative musculoskeletal ROS (+)   Abdominal   Peds  Hematology  (+) Blood dyscrasia (Hgb 9.9), anemia ,   Anesthesia Other Findings IOL for IUGR  Reproductive/Obstetrics (+) Pregnancy                             Anesthesia Physical Anesthesia Plan  ASA: II  Anesthesia Plan: Epidural   Post-op Pain Management:    Induction:   PONV Risk Score and Plan: Treatment may vary due to age or medical condition  Airway Management Planned: Natural Airway  Additional Equipment:   Intra-op Plan:   Post-operative Plan:   Informed Consent: I have reviewed the patients History and Physical, chart, labs and discussed the procedure including the risks, benefits and alternatives for the proposed anesthesia with the patient or authorized representative who has indicated his/her understanding and acceptance.       Plan Discussed with: Anesthesiologist  Anesthesia Plan Comments: (Patient identified. Risks, benefits, options discussed with patient including but not limited to bleeding, infection, nerve damage, paralysis, failed block, incomplete pain control, headache, blood pressure changes, nausea, vomiting, reactions to  medication, itching, and post partum back pain. Confirmed with bedside nurse the patient's most recent platelet count. Confirmed with the patient that they are not taking any anticoagulation, have any bleeding history or any family history of bleeding disorders. Patient expressed understanding and wishes to proceed. All questions were answered. )        Anesthesia Quick Evaluation

## 2021-01-11 NOTE — Progress Notes (Signed)
Labor Progress Note CALLAN YONTZ is a 20 y.o. G2P1001 at [redacted]w[redacted]d presented for IOL for FGR  S:  Feeling some ctx, had dose of Fentanyl. FB out around 1600. S/p Cytotec x1.  O:  BP 135/84   Pulse 79   Resp 18   Ht 5' 4.5" (1.638 m)   Wt 103.9 kg   LMP 04/13/2020   BMI 38.70 kg/m  EFM: baseline 135 bpm/ mod variability/ + accels/ no decels  Toco/IUPC: 2-4 SVE: 5/80/-1  A/P: 20 y.o. G2P1001 [redacted]w[redacted]d  1. Labor: early active 2. FWB: Cat I 3. Pain: analgesia/anesthesia prn  Recommend AROM, pt consents. AROM for large amt of clear fluid. Anticipate labor progress and SVD.  Donette Larry, CNM 6:10 PM

## 2021-01-11 NOTE — Progress Notes (Signed)
Labor Progress Note Rebecca Woodward is a 20 y.o. G2P1001 at [redacted]w[redacted]d presented for IOL for FGR  S:  Comfortable, no c/o.  O:  BP 130/80   Pulse (!) 114   Ht 5' 4.5" (1.638 m)   Wt 103.9 kg   LMP 04/13/2020   BMI 38.70 kg/m  EFM: baseline 140 bpm/ mod variability/ + accels/ no decels  Toco/IUPC: irreg SVE: Dilation: 1 Effacement (%): 50 Station: -2 Presentation: Vertex Exam by:: Donette Larry, CNM   A/P: 20 y.o. G2P1001 [redacted]w[redacted]d  1. Labor: latent 2. FWB: Cat I 3. Pain: analgesia/anesthesia prn   Consented for FB and Cytotec. FB inserted w/o difficulty, tolerated well. Start po Cytotec. Pitocin when appropriate. Anticipate labor progress and SVD.  Donette Larry, CNM 1:51 PM

## 2021-01-11 NOTE — H&P (Signed)
OBSTETRIC ADMISSION HISTORY AND PHYSICAL  Rebecca Woodward is a 20 y.o. female G2P1001 with IUP at [redacted]w[redacted]d presenting for IOL for FGR. She reports +FMs. No LOF, VB, blurry vision, headaches, peripheral edema, or RUQ pain. She plans on formula feeding. She requests OCPs for birth control.  Dating: By LMP --->  Estimated Date of Delivery: 01/18/21  Sono:    @[redacted]w[redacted]d , normal anatomy, ceph presentation, 2363g, 13%ile, EFW 5'3   Prenatal History/Complications: - hx of Epilepsy (no meds) - FGR - Anemia - Obesity - +Covid 10/2020  Past Medical History: Past Medical History:  Diagnosis Date  . Migraines     Past Surgical History: Past Surgical History:  Procedure Laterality Date  . NO PAST SURGERIES      Obstetrical History: OB History    Gravida  2   Para  1   Term  1   Preterm      AB      Living  1     SAB      IAB      Ectopic      Multiple  0   Live Births  1           Social History: Social History   Socioeconomic History  . Marital status: Single    Spouse name: Not on file  . Number of children: Not on file  . Years of education: Not on file  . Highest education level: Not on file  Occupational History  . Not on file  Tobacco Use  . Smoking status: Never Smoker  . Smokeless tobacco: Never Used  Vaping Use  . Vaping Use: Never used  Substance and Sexual Activity  . Alcohol use: Never  . Drug use: Never  . Sexual activity: Yes  Other Topics Concern  . Not on file  Social History Narrative  . Not on file   Social Determinants of Health   Financial Resource Strain: Not on file  Food Insecurity: Not on file  Transportation Needs: Not on file  Physical Activity: Not on file  Stress: Not on file  Social Connections: Not on file    Family History: Family History  Problem Relation Age of Onset  . Hypertension Maternal Grandmother   . Diabetes Maternal Grandmother   . Diabetes Maternal Grandfather   . Hypertension Maternal  Grandfather     Allergies: Allergies  Allergen Reactions  . Amoxicillin-Pot Clavulanate Diarrhea and Nausea And Vomiting    Medications Prior to Admission  Medication Sig Dispense Refill Last Dose  . aspirin EC 81 MG tablet Take 1 tablet (81 mg total) by mouth daily. Swallow whole. 60 tablet 11 Past Week at Unknown time  . Prenatal Vit-Min-FA-Fish Oil ([facility] PRENATAL GUMMY) 0.4-113.5 MG CHEW Chew by mouth.    Past Month at Unknown time  . acetaminophen (TYLENOL) 80 MG chewable tablet Chew 80 mg by mouth every 6 (six) hours as needed.        Review of Systems:  All systems reviewed and negative except as stated in HPI  PE: Blood pressure 130/80, pulse (!) 114, height 5' 4.5" (1.638 m), weight 103.9 kg, last menstrual period 04/13/2020, unknown if currently breastfeeding. General appearance: alert, cooperative and no distress Lungs: regular rate and effort Heart: regular rate  Abdomen: soft, non-tender Extremities: Homans sign is negative, no sign of DVT Presentation: cephalic EFM: 140 bpm, mod variability, + accels, no decels Toco: irreg Dilation: 1 Effacement (%): 50 Station: -2 Exam by:: [email or web address], CNM  Prenatal labs: ABO, Rh: --/--/PENDING (03/18 1320) Antibody: PENDING (03/18 1320) Rubella: 4.85 (08/27 0816) RPR: NON-REACTIVE (01/26 0000)  HBsAg: NON-REACTIVE (08/27 0816)  HIV: NON-REACTIVE (01/26 0000)  GBS:  Neg 2 hr GTT nml  Prenatal Transfer Tool  Maternal Diabetes: No Genetic Screening: Normal Maternal Ultrasounds/Referrals: Isolated EIF (echogenic intracardiac focus) Fetal Ultrasounds or other Referrals:  Referred to Materal Fetal Medicine  Maternal Substance Abuse:  No Significant Maternal Medications:  None Significant Maternal Lab Results: Group B Strep negative  Results for orders placed or performed during the hospital encounter of 01/11/21 (from the past 24 hour(s))  Type and screen   Collection Time: 01/11/21  1:20 PM  Result Value Ref  Range   ABO/RH(D) PENDING    Antibody Screen PENDING    Sample Expiration      01/14/2021,2359 Performed at [facility], [address] [place], [place] [address]     Patient Active Problem List   Diagnosis Date Noted  . Pregnancy affected by fetal growth restriction 01/11/2021  . Unwanted fertility 12/20/2020  . Small for gestational age 11/21/2020  . Abnormal fetal ultrasound 09/07/2020  . Anemia in pregnancy 06/25/2020  . Supervision of other normal pregnancy, antepartum 06/22/2020  . Obesity in pregnancy 06/22/2020  . Attention deficit hyperactivity disorder (ADHD) 07/24/2009  . Epilepsy (HCC) 07/24/2009    Assessment: Rebecca Woodward is a 20 y.o. G2P1001 at [redacted]w[redacted]d here for IOL for FGR  1. Labor: latent 2. FWB: Cat I 3. Pain: analgesia/anesthesia prn 4. GBS: neg   Plan: Admit to LD Cervical ripening Anticipate SVD  Donette Larry, CNM  01/11/2021, 1:54 PM

## 2021-01-12 ENCOUNTER — Encounter (HOSPITAL_COMMUNITY): Payer: Self-pay | Admitting: Obstetrics and Gynecology

## 2021-01-12 DIAGNOSIS — O365930 Maternal care for other known or suspected poor fetal growth, third trimester, not applicable or unspecified: Secondary | ICD-10-CM

## 2021-01-12 DIAGNOSIS — Z3A39 39 weeks gestation of pregnancy: Secondary | ICD-10-CM

## 2021-01-12 LAB — RPR: RPR Ser Ql: NONREACTIVE

## 2021-01-12 MED ORDER — ERYTHROMYCIN 5 MG/GM OP OINT
TOPICAL_OINTMENT | OPHTHALMIC | Status: AC
  Filled 2021-01-12: qty 1

## 2021-01-12 MED ORDER — SIMETHICONE 80 MG PO CHEW: 80.0000 mg | CHEWABLE_TABLET | ORAL | Status: DC | PRN

## 2021-01-12 MED ORDER — PRENATAL MULTIVITAMIN CH
1.0000 | ORAL_TABLET | Freq: Every day | ORAL | Status: DC
  Administered 2021-01-12 – 2021-01-13 (×2): 1 via ORAL
  Filled 2021-01-12 (×2): qty 1

## 2021-01-12 MED ORDER — COCONUT OIL OIL: 1.0000 "application " | TOPICAL_OIL | Status: DC | PRN

## 2021-01-12 MED ORDER — BENZOCAINE-MENTHOL 20-0.5 % EX AERO
1.0000 "application " | INHALATION_SPRAY | CUTANEOUS | Status: DC | PRN
  Administered 2021-01-12: 1 via TOPICAL
  Filled 2021-01-12: qty 56

## 2021-01-12 MED ORDER — ONDANSETRON HCL 4 MG/2ML IJ SOLN: 4.0000 mg | INTRAMUSCULAR | Status: DC | PRN

## 2021-01-12 MED ORDER — ONDANSETRON HCL 4 MG PO TABS: 4.0000 mg | ORAL_TABLET | ORAL | Status: DC | PRN

## 2021-01-12 MED ORDER — IBUPROFEN 600 MG PO TABS
600.0000 mg | ORAL_TABLET | Freq: Four times a day (QID) | ORAL | Status: DC
Start: 2021-01-12 — End: 2021-01-13
  Administered 2021-01-12 – 2021-01-13 (×6): 600 mg via ORAL
  Filled 2021-01-12 (×6): qty 1

## 2021-01-12 MED ORDER — DIPHENHYDRAMINE HCL 25 MG PO CAPS: 25.0000 mg | ORAL_CAPSULE | Freq: Four times a day (QID) | ORAL | Status: DC | PRN

## 2021-01-12 MED ORDER — TETANUS-DIPHTH-ACELL PERTUSSIS 5-2.5-18.5 LF-MCG/0.5 IM SUSY
0.5000 mL | PREFILLED_SYRINGE | Freq: Once | INTRAMUSCULAR | Status: DC
Start: 2021-01-13 — End: 2021-01-13

## 2021-01-12 MED ORDER — WITCH HAZEL-GLYCERIN EX PADS: 1.0000 "application " | MEDICATED_PAD | CUTANEOUS | Status: DC | PRN

## 2021-01-12 MED ORDER — FERROUS SULFATE 325 (65 FE) MG PO TABS
325.0000 mg | ORAL_TABLET | ORAL | Status: DC
  Administered 2021-01-12: 325 mg via ORAL
  Filled 2021-01-12: qty 1

## 2021-01-12 MED ORDER — DIBUCAINE (PERIANAL) 1 % EX OINT: 1.0000 "application " | TOPICAL_OINTMENT | CUTANEOUS | Status: DC | PRN

## 2021-01-12 MED ORDER — ZOLPIDEM TARTRATE 5 MG PO TABS: 5.0000 mg | ORAL_TABLET | Freq: Every evening | ORAL | Status: DC | PRN

## 2021-01-12 MED ORDER — ACETAMINOPHEN 325 MG PO TABS: 650.0000 mg | ORAL_TABLET | ORAL | Status: DC | PRN

## 2021-01-12 MED ORDER — SENNOSIDES-DOCUSATE SODIUM 8.6-50 MG PO TABS
2.0000 | ORAL_TABLET | ORAL | Status: DC
Start: 2021-01-12 — End: 2021-01-13
  Administered 2021-01-12 – 2021-01-13 (×2): 2 via ORAL
  Filled 2021-01-12 (×2): qty 2

## 2021-01-12 NOTE — Discharge Summary (Signed)
Postpartum Discharge Summary      Patient Name: Rebecca Woodward DOB: [birth year] MRN: [id number]  Date of admission: 01/11/2021 Delivery date:01/12/2021  Delivering provider: Lajean Manes  Date of discharge: 01/13/2021  Admitting diagnosis: Pregnancy affected by fetal growth restriction [O36.5990] Intrauterine pregnancy: [redacted]w[redacted]d    Secondary diagnosis:  Active Problems:   Pregnancy affected by fetal growth restriction   SVD (spontaneous vaginal delivery)   First degree perineal laceration during delivery  Additional problems: Epilepsy, obesity in pregnancy, anemia in pregnancy    Discharge diagnosis: Term Pregnancy Delivered and Anemia                                              Post partum procedures:none Augmentation: AROM, Cytotec and IP Foley Complications: None  Hospital course: Induction of Labor With Vaginal Delivery   20y.o. yo G2P1001 at 22w1das admitted to the hospital 01/11/2021 for induction of labor.  Indication for induction: IUGR.  Patient had an uncomplicated labor course as follows: Membrane Rupture Time/Date: 6:05 PM ,01/11/2021   Delivery Method:Vaginal, Spontaneous  Episiotomy: None  Lacerations:  Labial;1st degree  Details of delivery can be found in separate delivery note.  Patient had a routine postpartum course. Patient is discharged home 01/13/21.  Newborn Data: Birth date:01/12/2021  Birth time:12:51 AM  Gender:Female  Living status:Living  Apgars:9 ,9  Weight:2741 g   Magnesium Sulfate received: No BMZ received: No Rhophylac:N/A MMR:N/A T-DaP:Given prenatally- 12/05/20 Flu: Given prenatally - 09/26/20 Transfusion:No  Physical exam  Vitals:   01/12/21 1333 01/12/21 1649 01/12/21 2020 01/13/21 0447  BP: 108/75 112/71 128/83 107/76  Pulse: 79 71 80 65  Resp: 18 18 18 18   Temp: 97.9 F (36.6 C) 98.2 F (36.8 C) 98.3 F (36.8 C) 98.2 F (36.8 C)  TempSrc: Oral Oral Oral Oral  SpO2: 99% 100% 100%   Weight:      Height:        General: alert, cooperative and no distress Lochia: appropriate Uterine Fundus: firm Incision: N/A DVT Evaluation: No evidence of DVT seen on physical exam. Negative Homan's sign. No cords or calf tenderness. No significant calf/ankle edema. Labs: Lab Results  Component Value Date   WBC 10.4 01/11/2021   HGB 9.9 (L) 01/11/2021   HCT 31.1 (L) 01/11/2021   MCV 82.1 01/11/2021   PLT 359 01/11/2021   No flowsheet data found. Edinburgh Score: Edinburgh Postnatal Depression Scale Screening Tool 01/12/2021  I have been able to laugh and see the funny side of things. 0  I have looked forward with enjoyment to things. 0  I have blamed myself unnecessarily when things went wrong. 0  I have been anxious or worried for no good reason. 0  I have felt scared or panicky for no good reason. 0  Things have been getting on top of me. 0  I have been so unhappy that I have had difficulty sleeping. 0  I have felt sad or miserable. 0  I have been so unhappy that I have been crying. 0  The thought of harming myself has occurred to me. 0  Edinburgh Postnatal Depression Scale Total 0     After visit meds:  Allergies as of 01/13/2021      Reactions   Amoxicillin-pot Clavulanate Diarrhea, Nausea And Vomiting      Medication List  STOP taking these medications   aspirin EC 81 MG tablet     TAKE these medications   acetaminophen 80 MG chewable tablet Commonly known as: TYLENOL Chew 80 mg by mouth every 6 (six) hours as needed.   [facility]S Prenatal Gummy 0.4-113.5 MG Chew Chew by mouth.        Discharge home in stable condition Infant Feeding: No evidence of DVT seen on physical exam. Negative Homan's sign. No cords or calf tenderness. No significant calf/ankle edema. Infant Disposition:home with mother Discharge instruction: per After Visit Summary and Postpartum booklet. Activity: Advance as tolerated. Pelvic rest for 6 weeks.  Diet: routine diet Future Appointments: Future  Appointments  Date Time Provider [place]  02/15/2021  8:30 AM Julianne Handler, CNM [facility]   Follow up Visit:   Please schedule this patient for a In person postpartum visit in 4 weeks with the following provider: Any provider. Additional Postpartum F/U:none  High risk pregnancy complicated by: IUGR Delivery mode:  Vaginal, Spontaneous  Anticipated Birth Control:  OCPs   01/13/2021 Laury Deep, CNM

## 2021-01-12 NOTE — Anesthesia Postprocedure Evaluation (Signed)
Anesthesia Post Note  Patient: Rebecca Woodward  Procedure(s) Performed: AN AD HOC LABOR EPIDURAL     Patient location during evaluation: Mother Baby Anesthesia Type: Epidural Level of consciousness: awake and alert, oriented and patient cooperative Pain management: pain level controlled Vital Signs Assessment: post-procedure vital signs reviewed and stable Respiratory status: spontaneous breathing Cardiovascular status: stable Postop Assessment: no headache, epidural receding, patient able to bend at knees and no signs of nausea or vomiting Anesthetic complications: no Comments: Pt. States she is walking.  No c/o pain.   No complications documented.  Last Vitals:  Vitals:   01/12/21 0250 01/12/21 0345  BP: 122/74 120/71  Pulse: 62 63  Resp: 16 16  Temp: 36.9 C 36.8 C  SpO2: 100% 100%    Last Pain:  Vitals:   01/12/21 0620  TempSrc:   PainSc: 0-No pain   Pain Goal:                   [facility]

## 2021-01-13 NOTE — Progress Notes (Signed)
Patient ID: Rebecca Woodward, female   DOB: Dec 10, 2000, 20 y.o.   MRN: [id number] Patient reports that she "needs to go home today" d/t child care issues. She reports that her 75 year old son is with his paternal aunt who needs to go back to work tomorrow morning. She reports the pediatrician has not been to see the baby for discharge at this time. Reassurance given that d/c will be placed at this time.   Raelyn Mora, CNM

## 2021-01-13 NOTE — Progress Notes (Addendum)
Post Partum Day 1  Subjective: Patient resting comfortably, no complaints. She has been ambulating, voiding, tolerating PO, and reports + flatus. Patient requesting more information from nurse regarding possible extended stay for baby.   Objective: Blood pressure 107/76, pulse 65, temperature 98.2 F (36.8 C), temperature source Oral, resp. rate 18, height 5' 4.5" (1.638 m), weight 103.9 kg, last menstrual period 04/13/2020, SpO2 100 %, unknown if currently breastfeeding.  Physical Exam:  General: alert, cooperative and no distress Lochia: appropriate Uterine Fundus: soft DVT Evaluation: No evidence of DVT seen on physical exam. Negative Homan's sign.  Recent Labs    01/11/21 1320  HGB 9.9*  HCT 31.1*   Assessment/Plan: Plan for discharge tomorrow    LOS: 2 days   Renae Gloss 01/13/2021, 7:02 AM

## 2021-02-15 ENCOUNTER — Ambulatory Visit: Payer: Medicaid Other | Admitting: Certified Nurse Midwife

## 2021-02-19 ENCOUNTER — Other Ambulatory Visit: Payer: Self-pay

## 2021-02-19 ENCOUNTER — Ambulatory Visit (INDEPENDENT_AMBULATORY_CARE_PROVIDER_SITE_OTHER): Payer: Medicaid Other | Admitting: Advanced Practice Midwife

## 2021-02-19 ENCOUNTER — Encounter: Payer: Self-pay | Admitting: Advanced Practice Midwife

## 2021-02-19 VITALS — BP 114/71 | HR 86 | Resp 16 | Ht 66.0 in | Wt 216.0 lb

## 2021-02-19 DIAGNOSIS — Z3009 Encounter for other general counseling and advice on contraception: Secondary | ICD-10-CM

## 2021-02-19 DIAGNOSIS — Z9189 Other specified personal risk factors, not elsewhere classified: Secondary | ICD-10-CM

## 2021-02-19 MED ORDER — DROSPIRENONE-ETHINYL ESTRADIOL 3-0.02 MG PO TABS: 1.0000 | ORAL_TABLET | Freq: Every day | ORAL | 11 refills | Status: AC

## 2021-02-19 NOTE — Progress Notes (Signed)
Post Partum Visit Note  Rebecca Woodward is a 20 y.o. G55P2002 female who presents for a postpartum visit. She is 5 weeks postpartum following a normal spontaneous vaginal delivery.  I have fully reviewed the prenatal and intrapartum course. The delivery was at 39 gestational weeks.  Anesthesia: epidural. Postpartum course has been unremarkable. Baby is doing well. Baby is feeding by bottle - Enfamil AR. Bleeding no bleeding. Bowel function is normal. Bladder function is normal. Patient is sexually active. Contraception method is OCP (estrogen/progesterone). Postpartum depression screening: positive.   The pregnancy intention screening data noted above was reviewed. Potential methods of contraception were discussed. The patient elected to proceed with Oral Contraceptive.    Edinburgh Postnatal Depression Scale - 02/19/21 1045      Edinburgh Postnatal Depression Scale:  In the Past 7 Days   I have been able to laugh and see the funny side of things. 0    I have looked forward with enjoyment to things. 0    I have blamed myself unnecessarily when things went wrong. 1    I have been anxious or worried for no good reason. 2    I have felt scared or panicky for no good reason. 2    Things have been getting on top of me. 2    I have been so unhappy that I have had difficulty sleeping. 1    I have felt sad or miserable. 2    I have been so unhappy that I have been crying. 2    The thought of harming myself has occurred to me. 0    Edinburgh Postnatal Depression Scale Total 12           Health Maintenance Due  Topic Date Due  . COVID-19 Vaccine (1) Never done  . HPV VACCINES (1 - 2-dose series) Never done    The following portions of the patient's history were reviewed and updated as appropriate: allergies, current medications, past family history, past medical history, past social history, past surgical history and problem list.  Review of Systems Pertinent items noted in HPI and  remainder of comprehensive ROS otherwise negative.  Objective:  BP 114/71   Pulse 86   Resp 16   Ht 5\' 6"  (1.676 m)   Wt 216 lb (98 kg)   Breastfeeding No   BMI 34.86 kg/m    VS reviewed, nursing note reviewed,  Constitutional: well developed, well nourished, no distress HEENT: normocephalic CV: normal rate Pulm/chest wall: normal effort Abdomen: soft Neuro: alert and oriented x 3 Skin: warm, dry Psych: affect normal  Assessment:   1. At risk for postpartum depression --Pt young, has 2 small children, reports she is feeling overwhelmed at times and is tearful. She is bonding well with baby and reports her mom is supportive but it has been surprisingly harder with this second baby than it was the first time.  She has never had any counseling or tx for anxiety/depression.  - Ambulatory referral to Integrated Behavioral Health  2. Encounter for counseling regarding contraception --Discussed LARCs as most effective forms of birth control.  Discussed benefits/risks of other methods.  Pt desires OCPs.   Discussed failure rates of OCPs with regular use. Reviewed risk factors and s/sx of DVT/PE/reasons to seek medical care.  Rx for Yaz sent to pharmacy.  - drospirenone-ethinyl estradiol (YAZ) 3-0.02 MG tablet; Take 1 tablet by mouth daily.  Dispense: 28 tablet; Refill: 11   3. Postpartum examination following vaginal  delivery   Plan:   Essential components of care per ACOG recommendations:  1.  Mood and well being: Patient with negative depression screening today. Reviewed local resources for support.  See above for integrated [facility] referral.  - Patient does not use tobacco.  - hx of drug use? No    2. Infant care and feeding:  -Patient currently breastmilk feeding? No  -Social determinants of health (SDOH) reviewed in EPIC. No concerns  3. Sexuality, contraception and birth spacing - Patient does not want a pregnancy in the next year.   - Reviewed forms of contraception in  tiered fashion. Patient desired oral contraceptives (estrogen/progesterone) today.   - Discussed birth spacing of 18 months  4. Sleep and fatigue -Encouraged family/partner/community support of 4 hrs of uninterrupted sleep to help with mood and fatigue  5. Physical Recovery  - Discussed patients delivery without complications - Patient had a Vaginal, no problems at delivery. Perineal healing reviewed. Patient expressed understanding - Patient has urinary incontinence? No  - Patient is safe to resume physical and sexual activity  6.  Health Maintenance - HM due items addressed Yes - Last pap smear done 06/22/20 and was normal with negative HPV. Pap smear not done at today's visit.    7. Chronic Disease n/a - PCP follow up  Sharen Counter, CNM [facility], [facility]

## 2021-03-12 ENCOUNTER — Telehealth: Payer: Self-pay

## 2021-04-24 ENCOUNTER — Telehealth: Payer: Self-pay | Admitting: *Deleted

## 2021-04-24 NOTE — Telephone Encounter (Signed)
Left patient a message to call and schedule 3 month follow up appointment around 06/21/2021. Patient stated on 03/20/2021 that she was going to start her birth control on 03/21/2021.

## 2021-08-03 ENCOUNTER — Encounter: Payer: Self-pay | Admitting: Radiology

## 2022-02-19 ENCOUNTER — Telehealth: Payer: Self-pay | Admitting: *Deleted

## 2022-02-19 NOTE — Telephone Encounter (Signed)
Left patient a message to call and schedule annual.
